# Patient Record
Sex: Female | Born: 1970 | Race: White | Hispanic: No | Marital: Married | Smoking: Current every day smoker
Health system: Southern US, Community
[De-identification: ages and names within clinical notes are randomized; demographics above are authoritative.]

## PROBLEM LIST (undated history)

## (undated) DIAGNOSIS — F329 Major depressive disorder, single episode, unspecified: Secondary | ICD-10-CM

## (undated) DIAGNOSIS — F419 Anxiety disorder, unspecified: Secondary | ICD-10-CM

## (undated) DIAGNOSIS — F603 Borderline personality disorder: Secondary | ICD-10-CM

## (undated) DIAGNOSIS — J449 Chronic obstructive pulmonary disease, unspecified: Secondary | ICD-10-CM

## (undated) DIAGNOSIS — I499 Cardiac arrhythmia, unspecified: Secondary | ICD-10-CM

## (undated) DIAGNOSIS — N3281 Overactive bladder: Secondary | ICD-10-CM

## (undated) DIAGNOSIS — F4310 Post-traumatic stress disorder, unspecified: Secondary | ICD-10-CM

## (undated) DIAGNOSIS — R5383 Other fatigue: Secondary | ICD-10-CM

## (undated) DIAGNOSIS — F32A Depression, unspecified: Secondary | ICD-10-CM

## (undated) DIAGNOSIS — F319 Bipolar disorder, unspecified: Secondary | ICD-10-CM

## (undated) HISTORY — DX: Overactive bladder: N32.81

## (undated) HISTORY — DX: Bipolar disorder, unspecified: F31.9

## (undated) HISTORY — PX: TUBAL LIGATION: SHX77

## (undated) HISTORY — DX: Major depressive disorder, single episode, unspecified: F32.9

## (undated) HISTORY — DX: Other fatigue: R53.83

## (undated) HISTORY — DX: Borderline personality disorder: F60.3

## (undated) HISTORY — DX: Anxiety disorder, unspecified: F41.9

## (undated) HISTORY — DX: Depression, unspecified: F32.A

## (undated) HISTORY — DX: Post-traumatic stress disorder, unspecified: F43.10

## (undated) HISTORY — DX: Chronic obstructive pulmonary disease, unspecified: J44.9

---

## 2016-03-09 ENCOUNTER — Encounter: Payer: Self-pay | Admitting: Internal Medicine

## 2016-03-25 ENCOUNTER — Ambulatory Visit (INDEPENDENT_AMBULATORY_CARE_PROVIDER_SITE_OTHER): Payer: BLUE CROSS/BLUE SHIELD | Admitting: Nurse Practitioner

## 2016-03-25 ENCOUNTER — Other Ambulatory Visit: Payer: Self-pay

## 2016-03-25 ENCOUNTER — Encounter: Payer: Self-pay | Admitting: Nurse Practitioner

## 2016-03-25 ENCOUNTER — Other Ambulatory Visit (HOSPITAL_COMMUNITY)
Admission: RE | Admit: 2016-03-25 | Discharge: 2016-03-25 | Disposition: A | Discharge status: Home / Self Care | Payer: BLUE CROSS/BLUE SHIELD | Source: Ambulatory Visit | Attending: Nurse Practitioner | Admitting: Nurse Practitioner

## 2016-03-25 DIAGNOSIS — Z87898 Personal history of other specified conditions: Secondary | ICD-10-CM | POA: Diagnosis not present

## 2016-03-25 DIAGNOSIS — R7989 Other specified abnormal findings of blood chemistry: Secondary | ICD-10-CM

## 2016-03-25 DIAGNOSIS — R195 Other fecal abnormalities: Secondary | ICD-10-CM | POA: Diagnosis not present

## 2016-03-25 DIAGNOSIS — R945 Abnormal results of liver function studies: Principal | ICD-10-CM

## 2016-03-25 DIAGNOSIS — F1011 Alcohol abuse, in remission: Secondary | ICD-10-CM | POA: Insufficient documentation

## 2016-03-25 DIAGNOSIS — K625 Hemorrhage of anus and rectum: Secondary | ICD-10-CM

## 2016-03-25 LAB — COMPREHENSIVE METABOLIC PANEL
ALK PHOS: 128 U/L — AB (ref 38–126)
ALT: 38 U/L (ref 14–54)
AST: 97 U/L — AB (ref 15–41)
Albumin: 3.5 g/dL (ref 3.5–5.0)
Anion gap: 8 (ref 5–15)
CALCIUM: 8.9 mg/dL (ref 8.9–10.3)
CO2: 26 mmol/L (ref 22–32)
CREATININE: 0.47 mg/dL (ref 0.44–1.00)
Chloride: 105 mmol/L (ref 101–111)
Glucose, Bld: 106 mg/dL — ABNORMAL HIGH (ref 65–99)
Potassium: 4 mmol/L (ref 3.5–5.1)
Sodium: 139 mmol/L (ref 135–145)
Total Bilirubin: 0.9 mg/dL (ref 0.3–1.2)
Total Protein: 7.4 g/dL (ref 6.5–8.1)

## 2016-03-25 LAB — CBC WITH DIFFERENTIAL/PLATELET
BASOS ABS: 0 10*3/uL (ref 0.0–0.1)
Basophils Relative: 0 %
Eosinophils Absolute: 0 10*3/uL (ref 0.0–0.7)
Eosinophils Relative: 0 %
HCT: 51.9 % — ABNORMAL HIGH (ref 36.0–46.0)
HEMOGLOBIN: 18.2 g/dL — AB (ref 12.0–15.0)
LYMPHS ABS: 1.2 10*3/uL (ref 0.7–4.0)
LYMPHS PCT: 13 %
MCH: 36.9 pg — AB (ref 26.0–34.0)
MCHC: 35.1 g/dL (ref 30.0–36.0)
MCV: 105.3 fL — AB (ref 78.0–100.0)
Monocytes Absolute: 0.5 10*3/uL (ref 0.1–1.0)
Monocytes Relative: 6 %
NEUTROS ABS: 7.4 10*3/uL (ref 1.7–7.7)
NEUTROS PCT: 81 %
Platelets: 168 10*3/uL (ref 150–400)
RBC: 4.93 MIL/uL (ref 3.87–5.11)
RDW: 15.2 % (ref 11.5–15.5)
WBC: 9.2 10*3/uL (ref 4.0–10.5)

## 2016-03-25 MED ORDER — PEG 3350-KCL-NA BICARB-NACL 420 G PO SOLR: 4000.0000 mL | ORAL | 0 refills | Status: DC

## 2016-03-25 NOTE — Patient Instructions (Signed)
1. Have your labs drawn when you're able to. 2. We will help you schedule your ultrasound of your liver. 3. Continue your excellent for's at avoiding alcohol. 4. We will schedule your procedures for you. 5. Further recommendations to be made based on the results of your procedures. 6. Return for follow-up in 8 weeks.

## 2016-03-25 NOTE — Progress Notes (Signed)
Primary Care Physician:  Alanda Amass, MD Primary Gastroenterologist:  Dr. Gala Romney  Chief Complaint  Patient presents with  . Abdominal Pain    RUQ, mid upper abd  . Elevated Hepatic Enzymes  . Diarrhea    HPI:   Tiffany Novak is a 45 y.o. female who presents on referral from primary care due to alcohol dependence, questionable liver damage, elevated LFTs, low magnesium and folate, never been to GI rather colonoscopy. The patient last saw primary care on 07/25/2015 at which point noted for the previous 2 weeks she was having fecal urgency with stools ranging from soft to diarrhea. Has never had colonoscopy before. Negative stool specimen. She was given a course of dicyclomine. Labs provided include CMP dated 03/04/2016 which show elevated alkaline phosphatase at 151, elevated AST/ALT 123/53, normal albumin at 3.79, CBC with a normal platelet count of 169, vitamin 123456 normal, folic acid low at 4.2. TSH normal, vitamin D normal, magnesium low at 1.38, hepatitis C antibody negative. Previous liver functions were normal in 2016.  Today she states she is tired a lot overall. Poor energy, sleeps a lot. Has RUQ abdominal pain and epigastric pain typically when vomiting and sharp. RUQ pain described as dull pain, intermittent, occurs about 3-4 times a day and lasts 10 min to 30 min, self-resolved typically with laying down and changing position. She has had people tell her her skin/eyes have yellowed, but her mom states she has never seen this. Denies darkened urine. Denies LE edema, abdominal swelling. Has had 1-2 episodes of rectal bleeding/brbpr last noted 2 weeks ago, 1-2 episodes then resolves. This was the only occurrence. Has noted dark, tarry stools. Denies chest pain, dyspnea, dizziness, lightheadedness, syncope, near syncope. Denies any other upper or lower GI symptoms.  Denies NSAIDs and ASA powders.  Past Medical History:  Diagnosis Date  . Anxiety and depression   . Bipolar disorder  ([place])   . Borderline personality disorder   . COPD (chronic obstructive pulmonary disease) ([place])   . Fatigue   . OAB (overactive bladder)   . PTSD (post-traumatic stress disorder)     No past surgical history on file.  Current Outpatient Prescriptions  Medication Sig Dispense Refill  . diazepam (VALIUM) 10 MG tablet Take 10 mg by mouth 3 (three) times daily as needed.    . Prenatal Vit-Fe Fumarate-FA (PNV PRENATAL PLUS MULTIVITAMIN) 27-1 MG TABS Take 1 tablet by mouth daily.    Marland Kitchen topiramate (TOPAMAX) 50 MG tablet Take 50 mg by mouth at bedtime.    . TRINTELLIX 10 MG TABS Take 10 mg by mouth daily.    . polyethylene glycol-electrolytes (TRILYTE) 420 g solution Take 4,000 mLs by mouth as directed. 4000 mL 0   No current facility-administered medications for this visit.     Allergies as of 03/25/2016  . (No Known Allergies)    Family History  Problem Relation Age of Onset  . Colon cancer Neg Hx   . Liver disease Neg Hx     Social History   Social History  . Marital status: Married    Spouse name: N/A  . Number of children: N/A  . Years of education: N/A   Occupational History  . Not on file.   Social History Main Topics  . Smoking status: Current Every Day Smoker    Packs/day: 1.00  . Smokeless tobacco: Never Used  . Alcohol use No     Comment: None since 03/22/16; Has been cutting back for 3  months (12/2015). Previously significant liquor use ($600/month worth)  . Drug use: No  . Sexual activity: Not on file   Other Topics Concern  . Not on file   Social History Narrative  . No narrative on file    Review of Systems: General: Negative for anorexia, weight loss, fever, chills, weakness. Eyes: Negative for vision changes.  ENT: Negative for hoarseness, difficulty swallowing. CV: Negative for chest pain, angina, palpitations, peripheral edema.  Respiratory: Negative for dyspnea at rest, cough, sputum, wheezing.  GI: See history of present illness. MS:  Negative for joint pain, low back pain.  Derm: Negative for rash or itching.  Neuro: Negative for memory loss, confusion.  Endo: Negative for unusual weight change.  Heme: Negative for bruising or bleeding.    Physical Exam: BP (!) 145/96   Pulse (!) 11   Temp 98.1 F (36.7 C) (Oral)   Ht 5\' 2"  (1.575 m)   Wt 123 lb 12.8 oz (56.2 kg)   BMI 22.64 kg/m  General:   Alert and oriented. Pleasant and cooperative. Well-nourished and well-developed.  Head:  Normocephalic and atraumatic. Eyes:  Without icterus, sclera clear and conjunctiva pink.  Ears:  Normal auditory acuity. Cardiovascular:  S1, S2 present without murmurs appreciated. Extremities without clubbing or edema. Respiratory:  Clear to auscultation bilaterally. No wheezes, rales, or rhonchi. No distress.  Gastrointestinal:  +BS, soft, and non-distended. No HSM noted. No guarding or rebound. No masses appreciated.  Rectal:  Deferred  Musculoskalatal:  Symmetrical without gross deformities.  Neurologic:  Alert and oriented x4;  grossly normal neurologically. Psych:  Alert and cooperative. Normal mood and affect. Heme/Lymph/Immune: No excessive bruising noted.    03/26/2016 3:10 PM   Disclaimer: This note was dictated with voice recognition software. Similar sounding words can inadvertently be transcribed and may not be corrected upon review.

## 2016-03-25 NOTE — Telephone Encounter (Signed)
Open error 

## 2016-03-26 ENCOUNTER — Encounter: Payer: Self-pay | Admitting: Nurse Practitioner

## 2016-03-26 LAB — HEPATITIS B SURFACE ANTIGEN: HEP B S AG: NEGATIVE

## 2016-03-26 LAB — ANTI-SMOOTH MUSCLE ANTIBODY, IGG: F-Actin IgG: 10 Units (ref 0–19)

## 2016-03-26 LAB — HEPATITIS B CORE ANTIBODY, TOTAL: HEP B C TOTAL AB: NEGATIVE

## 2016-03-26 LAB — MITOCHONDRIAL ANTIBODIES: Mitochondrial M2 Ab, IgG: 18.9 Units (ref 0.0–20.0)

## 2016-03-26 LAB — ANTINUCLEAR ANTIBODIES, IFA: ANTINUCLEAR ANTIBODIES, IFA: NEGATIVE

## 2016-03-26 NOTE — Assessment & Plan Note (Signed)
The patient is 45 years old but notes bright red blood per rectum 2 weeks ago. Given her age, and symptomatic presentation we will proceed with colonoscopy in addition to endoscopy as noted below. Return for follow-up in 8 weeks. The patient denies NSAID and aspirin powders.  Proceed with TCS on propofol/MAC with Dr. Gala Romney in near future: the risks, benefits, and alternatives have been discussed with the patient in detail. The patient states understanding and desires to proceed.  The patient is on Valium. She is not on any other anticoagulants, anxiolytics, chronic pain medications, or antidepressants. She does have a history of alcoholism and alcohol abuse. We will proceed with the procedure on propofol/MAC to promote adequate sedation.

## 2016-03-26 NOTE — Assessment & Plan Note (Addendum)
The patient notes dark stools consistent with melena. She also has elevated liver function tests, we are completing liver workup. We will add on  upper endoscopy in addition to her colonoscopy as above to evaluate for sources of melena. Not sure of the utility of checking heme stool card given her admitted hematochezia and this would likely not prove diagnostic for melena versus non-heme dark stools. Recommend she avoid further alcohol consumption. She does have epigastric pain when vomiting, possibility of Mallory-Weiss tear intermittently. Also the possibility of alcohol-induced gastritis. Also possibility of esophagitis, peptic ulcer disease. She denies NSAIDs and aspirin powder use. Return for follow-up in 8 weeks.  Proceed with EGD on propofol/MAC with Dr. Gala Romney in near future: the risks, benefits, and alternatives have been discussed with the patient in detail. The patient states understanding and desires to proceed.  The patient is on Valium. She is not on any other anticoagulants, anxiolytics, chronic pain medications, or antidepressants. She does have a history of alcoholism and alcohol abuse. We will proceed with the procedure on propofol/MAC to promote adequate sedation.

## 2016-03-26 NOTE — Assessment & Plan Note (Signed)
The patient has a history of elevated LFTs, likely due to alcohol consumption. However, cannot rule out other etiologies. Hepatitis C antibody is artery been checked. Today I will recheck liver enzymes with a CMP. I will also check CBC, ANA, anti-smooth muscle antibody, antimicrosomal meats oatmeal antibody of the kidney and liver, hepatitis B serologies. I will also do a right upper quadrant ultrasound elastography to check for fibrosis and cirrhosis. Return for follow-up in 8 weeks.

## 2016-03-26 NOTE — Assessment & Plan Note (Signed)
The patient has a history of alcoholism and alcohol abuse. She has not had alcohol in 3 days after spending 2-3 months slowly reducing her consumption. She seems determined to quit alcohol. Recommend she avoid alcoholic given her elevated liver function tests. Further liver workup as noted below. Return for follow-up in 8 weeks.

## 2016-03-30 NOTE — Progress Notes (Signed)
CC'D TO PCP °

## 2016-03-31 ENCOUNTER — Ambulatory Visit (HOSPITAL_COMMUNITY)
Admission: RE | Admit: 2016-03-31 | Discharge: 2016-03-31 | Disposition: A | Discharge status: Home / Self Care | Payer: BLUE CROSS/BLUE SHIELD | Source: Ambulatory Visit | Attending: Nurse Practitioner | Admitting: Nurse Practitioner

## 2016-03-31 DIAGNOSIS — K625 Hemorrhage of anus and rectum: Secondary | ICD-10-CM | POA: Insufficient documentation

## 2016-03-31 DIAGNOSIS — Z87898 Personal history of other specified conditions: Secondary | ICD-10-CM | POA: Diagnosis not present

## 2016-03-31 DIAGNOSIS — R7989 Other specified abnormal findings of blood chemistry: Secondary | ICD-10-CM | POA: Insufficient documentation

## 2016-03-31 DIAGNOSIS — R195 Other fecal abnormalities: Secondary | ICD-10-CM | POA: Diagnosis not present

## 2016-03-31 DIAGNOSIS — F1011 Alcohol abuse, in remission: Secondary | ICD-10-CM

## 2016-03-31 DIAGNOSIS — R945 Abnormal results of liver function studies: Secondary | ICD-10-CM

## 2016-04-06 ENCOUNTER — Telehealth: Payer: Self-pay

## 2016-04-06 NOTE — Telephone Encounter (Signed)
Pt is calling to get her results from her [place]. Please advise

## 2016-04-07 ENCOUNTER — Telehealth: Payer: Self-pay | Admitting: Internal Medicine

## 2016-04-07 NOTE — Telephone Encounter (Signed)
Routing to EG 

## 2016-04-07 NOTE — Telephone Encounter (Signed)
Patient calling for her ultrasound results, [phone]

## 2016-04-07 NOTE — Telephone Encounter (Signed)
See result note.  

## 2016-04-08 ENCOUNTER — Telehealth: Payer: Self-pay | Admitting: Internal Medicine

## 2016-04-08 NOTE — Telephone Encounter (Signed)
Ginger spoke with the pt and answered her questions.

## 2016-04-08 NOTE — Telephone Encounter (Signed)
Pt said she was returning JL call. JL not available and call transferred to VM

## 2016-04-08 NOTE — Telephone Encounter (Signed)
Tried to call pt- NA- LMOM 

## 2016-04-16 NOTE — Patient Instructions (Signed)
Tiffany Novak  04/16/2016     @PREFPERIOPPHARMACY @   Your procedure is scheduled on  04/24/2016  Report to [facility] at  1015  A.M.  Call this number if you have problems the morning of surgery:  [phone]   Remember:  Do not eat food or drink liquids after midnight.  Take these medicines the morning of surgery with A SIP OF WATER  Valium, topamax. Take your inhaler before you come.   Do not wear jewelry, make-up or nail polish.  Do not wear lotions, powders, or perfumes, or deoderant.  Do not shave 48 hours prior to surgery.  Men may shave face and neck.  Do not bring valuables to the hospital.  [facility] is not responsible for any belongings or valuables.  Contacts, dentures or bridgework may not be worn into surgery.  Leave your suitcase in the car.  After surgery it may be brought to your room.  For patients admitted to the hospital, discharge time will be determined by your treatment team.  Patients discharged the day of surgery will not be allowed to drive home.   Name and phone number of your driver:   family Special instructions:  Follow the diet and prep instructions given to you by Dr Roseanne Kaufman office.  Please read over the following fact sheets that you were given. Anesthesia Post-op Instructions and Care and Recovery After Surgery       Esophagogastroduodenoscopy Introduction Esophagogastroduodenoscopy (EGD) is a procedure to examine the lining of the esophagus, stomach, and first part of the small intestine (duodenum). This procedure is done to check for problems such as inflammation, bleeding, ulcers, or growths. During this procedure, a long, flexible, lighted tube with a camera attached (endoscope) is inserted down the throat. Tell a health care provider about:  Any allergies you have.  All medicines you are taking, including vitamins, herbs, eye drops, creams, and over-the-counter medicines.  Any problems you or family  members have had with anesthetic medicines.  Any blood disorders you have.  Any surgeries you have had.  Any medical conditions you have.  Whether you are pregnant or may be pregnant. What are the risks? Generally, this is a safe procedure. However, problems may occur, including:  Infection.  Bleeding.  A tear (perforation) in the esophagus, stomach, or duodenum.  Trouble breathing.  Excessive sweating.  Spasms of the larynx.  A slowed heartbeat.  Low blood pressure. What happens before the procedure?  Follow instructions from your health care provider about eating or drinking restrictions.  Ask your health care provider about:  Changing or stopping your regular medicines. This is especially important if you are taking diabetes medicines or blood thinners.  Taking medicines such as aspirin and ibuprofen. These medicines can thin your blood. Do not take these medicines before your procedure if your health care provider instructs you not to.  Plan to have someone take you home after the procedure.  If you wear dentures, be ready to remove them before the procedure. What happens during the procedure?  To reduce your risk of infection, your health care team will wash or sanitize their hands.  An IV tube will be put in a vein in your hand or arm. You will get medicines and fluids through this tube.  You will be given one or more of the following:  A medicine to help you relax (sedative).  A medicine to numb the  area (local anesthetic). This medicine may be sprayed into your throat. It will make you feel more comfortable and keep you from gagging or coughing during the procedure.  A medicine for pain.  A mouth guard may be placed in your mouth to protect your teeth and to keep you from biting on the endoscope.  You will be asked to lie on your left side.  The endoscope will be lowered down your throat into your esophagus, stomach, and duodenum.  Air will be put  into the endoscope. This will help your health care provider see better.  The lining of your esophagus, stomach, and duodenum will be examined.  Your health care provider may:  Take a tissue sample so it can be looked at in a lab (biopsy).  Remove growths.  Remove objects (foreign bodies) that are stuck.  Treat any bleeding with medicines or other devices that stop tissue from bleeding.  Widen (dilate) or stretch narrowed areas of your esophagus and stomach.  The endoscope will be taken out. The procedure may vary among health care providers and hospitals. What happens after the procedure?  Your blood pressure, heart rate, breathing rate, and blood oxygen level will be monitored often until the medicines you were given have worn off.  Do not eat or drink anything until the numbing medicine has worn off and your gag reflex has returned. This information is not intended to replace advice given to you by your health care provider. Make sure you discuss any questions you have with your health care provider. Document Released: 08/07/2004 Document Revised: 09/12/2015 Document Reviewed: 02/28/2015  2017 Elsevier Esophagogastroduodenoscopy, Care After Introduction Refer to this sheet in the next few weeks. These instructions provide you with information about caring for yourself after your procedure. Your health care provider may also give you more specific instructions. Your treatment has been planned according to current medical practices, but problems sometimes occur. Call your health care provider if you have any problems or questions after your procedure. What can I expect after the procedure? After the procedure, it is common to have:  A sore throat.  Nausea.  Bloating.  Dizziness.  Fatigue. Follow these instructions at home:  Do not eat or drink anything until the numbing medicine (local anesthetic) has worn off and your gag reflex has returned. You will know that the local  anesthetic has worn off when you can swallow comfortably.  Do not drive for 24 hours if you received a medicine to help you relax (sedative).  If your health care provider took a tissue sample for testing during the procedure, make sure to get your test results. This is your responsibility. Ask your health care provider or the department performing the test when your results will be ready.  Keep all follow-up visits as told by your health care provider. This is important. Contact a health care provider if:  You cannot stop coughing.  You are not urinating.  You are urinating less than usual. Get help right away if:  You have trouble swallowing.  You cannot eat or drink.  You have throat or chest pain that gets worse.  You are dizzy or light-headed.  You faint.  You have nausea or vomiting.  You have chills.  You have a fever.  You have severe abdominal pain.  You have black, tarry, or bloody stools. This information is not intended to replace advice given to you by your health care provider. Make sure you discuss any questions you have  with your health care provider. Document Released: 03/23/2012 Document Revised: 09/12/2015 Document Reviewed: 02/28/2015  2017 Elsevier  Colonoscopy, Adult A colonoscopy is an exam to look at the entire large intestine. During the exam, a lubricated, bendable tube is inserted into the anus and then passed into the rectum, colon, and other parts of the large intestine. A colonoscopy is often done as a part of normal colorectal screening or in response to certain symptoms, such as anemia, persistent diarrhea, abdominal pain, and blood in the stool. The exam can help screen for and diagnose medical problems, including:  Tumors.  Polyps.  Inflammation.  Areas of bleeding. Tell a health care provider about:  Any allergies you have.  All medicines you are taking, including vitamins, herbs, eye drops, creams, and over-the-counter  medicines.  Any problems you or family members have had with anesthetic medicines.  Any blood disorders you have.  Any surgeries you have had.  Any medical conditions you have.  Any problems you have had passing stool. What are the risks? Generally, this is a safe procedure. However, problems may occur, including:  Bleeding.  A tear in the intestine.  A reaction to medicines given during the exam.  Infection (rare). What happens before the procedure? Eating and drinking restrictions  Follow instructions from your health care provider about eating and drinking, which may include:  A few days before the procedure - follow a low-fiber diet. Avoid nuts, seeds, dried fruit, raw fruits, and vegetables.  1-3 days before the procedure - follow a clear liquid diet. Drink only clear liquids, such as clear broth or bouillon, black coffee or tea, clear juice, clear soft drinks or sports drinks, gelatin desert, and popsicles. Avoid any liquids that contain red or purple dye.  On the day of the procedure - do not eat or drink anything during the 2 hours before the procedure, or within the time period that your health care provider recommends. Bowel prep  If you were prescribed an oral bowel prep to clean out your colon:  Take it as told by your health care provider. Starting the day before your procedure, you will need to drink a large amount of medicated liquid. The liquid will cause you to have multiple loose stools until your stool is almost clear or light green.  If your skin or anus gets irritated from diarrhea, you may use these to relieve the irritation:  Medicated wipes, such as adult wet wipes with aloe and vitamin E.  A skin soothing-product like petroleum jelly.  If you vomit while drinking the bowel prep, take a break for up to 60 minutes and then begin the bowel prep again. If vomiting continues and you cannot take the bowel prep without vomiting, call your health care  provider. General instructions  Ask your health care provider about changing or stopping your regular medicines. This is especially important if you are taking diabetes medicines or blood thinners.  Plan to have someone take you home from the hospital or clinic. What happens during the procedure?  An IV tube may be inserted into one of your veins.  You will be given medicine to help you relax (sedative).  To reduce your risk of infection:  Your health care team will wash or sanitize their hands.  Your anal area will be washed with soap.  You will be asked to lie on your side with your knees bent.  Your health care provider will lubricate a long, thin, flexible tube. The tube will  have a camera and a light on the end.  The tube will be inserted into your anus.  The tube will be gently eased through your rectum and colon.  Air will be delivered into your colon to keep it open. You may feel some pressure or cramping.  The camera will be used to take images during the procedure.  A small tissue sample may be removed from your body to be examined under a microscope (biopsy). If any potential problems are found, the tissue will be sent to a lab for testing.  If small polyps are found, your health care provider may remove them and have them checked for cancer cells.  The tube that was inserted into your anus will be slowly removed. The procedure may vary among health care providers and hospitals. What happens after the procedure?  Your blood pressure, heart rate, breathing rate, and blood oxygen level will be monitored until the medicines you were given have worn off.  Do not drive for 24 hours after the exam.  You may have a small amount of blood in your stool.  You may pass gas and have mild abdominal cramping or bloating due to the air that was used to inflate your colon during the exam.  It is up to you to get the results of your procedure. Ask your health care provider, or  the department performing the procedure, when your results will be ready. This information is not intended to replace advice given to you by your health care provider. Make sure you discuss any questions you have with your health care provider. Document Released: 04/03/2000 Document Revised: 10/25/2015 Document Reviewed: 06/18/2015 Elsevier Interactive Patient Education  2017 [employer]  Colonoscopy, Adult, Care After This sheet gives you information about how to care for yourself after your procedure. Your health care provider may also give you more specific instructions. If you have problems or questions, contact your health care provider. What can I expect after the procedure? After the procedure, it is common to have:  A small amount of blood in your stool for 24 hours after the procedure.  Some gas.  Mild abdominal cramping or bloating. Follow these instructions at home: General instructions  For the first 24 hours after the procedure:  Do not drive or use machinery.  Do not sign important documents.  Do not drink alcohol.  Do your regular daily activities at a slower pace than normal.  Eat soft, easy-to-digest foods.  Rest often.  Take over-the-counter or prescription medicines only as told by your health care provider.  It is up to you to get the results of your procedure. Ask your health care provider, or the department performing the procedure, when your results will be ready. Relieving cramping and bloating  Try walking around when you have cramps or feel bloated.  Apply heat to your abdomen as told by your health care provider. Use a heat source that your health care provider recommends, such as a moist heat pack or a heating pad.  Place a towel between your skin and the heat source.  Leave the heat on for 20-30 minutes.  Remove the heat if your skin turns bright red. This is especially important if you are unable to feel pain, heat, or cold. You may have a  greater risk of getting burned. Eating and drinking  Drink enough fluid to keep your urine clear or pale yellow.  Resume your normal diet as instructed by your health care provider. Avoid heavy  or fried foods that are hard to digest.  Avoid drinking alcohol for as long as instructed by your health care provider. Contact a health care provider if:  You have blood in your stool 2-3 days after the procedure. Get help right away if:  You have more than a small spotting of blood in your stool.  You pass large blood clots in your stool.  Your abdomen is swollen.  You have nausea or vomiting.  You have a fever.  You have increasing abdominal pain that is not relieved with medicine. This information is not intended to replace advice given to you by your health care provider. Make sure you discuss any questions you have with your health care provider. Document Released: 11/19/2003 Document Revised: 12/30/2015 Document Reviewed: 06/18/2015 Elsevier Interactive Patient Education  2017 [place] Anesthesia is a term that refers to techniques, procedures, and medicines that help a person stay safe and comfortable during a medical procedure. Monitored anesthesia care, or sedation, is one type of anesthesia. Your anesthesia specialist may recommend sedation if you will be having a procedure that does not require you to be unconscious, such as:  Cataract surgery.  A dental procedure.  A biopsy.  A colonoscopy. During the procedure, you may receive a medicine to help you relax (sedative). There are three levels of sedation:  Mild sedation. At this level, you may feel awake and relaxed. You will be able to follow directions.  Moderate sedation. At this level, you will be sleepy. You may not remember the procedure.  Deep sedation. At this level, you will be asleep. You will not remember the procedure. The more medicine you are given, the deeper your level of  sedation will be. Depending on how you respond to the procedure, the anesthesia specialist may change your level of sedation or the type of anesthesia to fit your needs. An anesthesia specialist will monitor you closely during the procedure. Let your health care provider know about:  Any allergies you have.  All medicines you are taking, including vitamins, herbs, eye drops, creams, and over-the-counter medicines.  Any use of steroids (by mouth or as a cream).  Any problems you or family members have had with sedatives and anesthetic medicines.  Any blood disorders you have.  Any surgeries you have had.  Any medical conditions you have, such as sleep apnea.  Whether you are pregnant or may be pregnant.  Any use of cigarettes, alcohol, or street drugs. What are the risks? Generally, this is a safe procedure. However, problems may occur, including:  Getting too much medicine (oversedation).  Nausea.  Allergic reaction to medicines.  Trouble breathing. If this happens, a breathing tube may be used to help with breathing. It will be removed when you are awake and breathing on your own.  Heart trouble.  Lung trouble. Before the procedure Staying hydrated  Follow instructions from your health care provider about hydration, which may include:  Up to 2 hours before the procedure - you may continue to drink clear liquids, such as water, clear fruit juice, black coffee, and plain tea. Eating and drinking restrictions  Follow instructions from your health care provider about eating and drinking, which may include:  8 hours before the procedure - stop eating heavy meals or foods such as meat, fried foods, or fatty foods.  6 hours before the procedure - stop eating light meals or foods, such as toast or cereal.  6 hours before the procedure -  stop drinking milk or drinks that contain milk.  2 hours before the procedure - stop drinking clear liquids. Medicines  Ask your health  care provider about:  Changing or stopping your regular medicines. This is especially important if you are taking diabetes medicines or blood thinners.  Taking medicines such as aspirin and ibuprofen. These medicines can thin your blood. Do not take these medicines before your procedure if your health care provider instructs you not to. Tests and exams  You will have a physical exam.  You may have blood tests done to show:  How well your kidneys and liver are working.  How well your blood can clot.  General instructions  Plan to have someone take you home from the hospital or clinic.  If you will be going home right after the procedure, plan to have someone with you for 24 hours. What happens during the procedure?  Your blood pressure, heart rate, breathing, level of pain and overall condition will be monitored.  An IV tube will be inserted into one of your veins.  Your anesthesia specialist will give you medicines as needed to keep you comfortable during the procedure. This may mean changing the level of sedation.  The procedure will be performed. After the procedure  Your blood pressure, heart rate, breathing rate, and blood oxygen level will be monitored until the medicines you were given have worn off.  Do not drive for 24 hours if you received a sedative.  You may:  Feel sleepy, clumsy, or nauseous.  Feel forgetful about what happened after the procedure.  Have a sore throat if you had a breathing tube during the procedure.  Vomit. This information is not intended to replace advice given to you by your health care provider. Make sure you discuss any questions you have with your health care provider. Document Released: 12/31/2004 Document Revised: 09/13/2015 Document Reviewed: 07/28/2015 Elsevier Interactive Patient Education  2017 [place], Care After These instructions provide you with information about caring for yourself after  your procedure. Your health care provider may also give you more specific instructions. Your treatment has been planned according to current medical practices, but problems sometimes occur. Call your health care provider if you have any problems or questions after your procedure. What can I expect after the procedure? After your procedure, it is common to:  Feel sleepy for several hours.  Feel clumsy and have poor balance for several hours.  Feel forgetful about what happened after the procedure.  Have poor judgment for several hours.  Feel nauseous or vomit.  Have a sore throat if you had a breathing tube during the procedure. Follow these instructions at home: For at least 24 hours after the procedure:   Do not:  Participate in activities in which you could fall or become injured.  Drive.  Use heavy machinery.  Drink alcohol.  Take sleeping pills or medicines that cause drowsiness.  Make important decisions or sign legal documents.  Take care of children on your own.  Rest. Eating and drinking  Follow the diet that is recommended by your health care provider.  If you vomit, drink water, juice, or soup when you can drink without vomiting.  Make sure you have little or no nausea before eating solid foods. General instructions  Have a responsible adult stay with you until you are awake and alert.  Take over-the-counter and prescription medicines only as told by your health care provider.  If you smoke, do  not smoke without supervision.  Keep all follow-up visits as told by your health care provider. This is important. Contact a health care provider if:  You keep feeling nauseous or you keep vomiting.  You feel light-headed.  You develop a rash.  You have a fever. Get help right away if:  You have trouble breathing. This information is not intended to replace advice given to you by your health care provider. Make sure you discuss any questions you have  with your health care provider. Document Released: 07/28/2015 Document Revised: 11/27/2015 Document Reviewed: 07/28/2015 Elsevier Interactive Patient Education  2017 [employer].

## 2016-04-21 ENCOUNTER — Encounter (HOSPITAL_COMMUNITY)
Admission: RE | Admit: 2016-04-21 | Discharge: 2016-04-21 | Disposition: A | Discharge status: Home / Self Care | Payer: BLUE CROSS/BLUE SHIELD | Source: Ambulatory Visit | Attending: Internal Medicine | Admitting: Internal Medicine

## 2016-04-21 ENCOUNTER — Encounter (HOSPITAL_COMMUNITY): Payer: Self-pay

## 2016-04-21 DIAGNOSIS — K921 Melena: Secondary | ICD-10-CM | POA: Diagnosis not present

## 2016-04-21 DIAGNOSIS — K5730 Diverticulosis of large intestine without perforation or abscess without bleeding: Secondary | ICD-10-CM | POA: Diagnosis not present

## 2016-04-21 DIAGNOSIS — R1011 Right upper quadrant pain: Secondary | ICD-10-CM | POA: Diagnosis present

## 2016-04-21 DIAGNOSIS — K209 Esophagitis, unspecified: Secondary | ICD-10-CM | POA: Diagnosis not present

## 2016-04-21 DIAGNOSIS — K635 Polyp of colon: Secondary | ICD-10-CM | POA: Diagnosis not present

## 2016-04-21 DIAGNOSIS — F17210 Nicotine dependence, cigarettes, uncomplicated: Secondary | ICD-10-CM | POA: Diagnosis not present

## 2016-04-21 DIAGNOSIS — J449 Chronic obstructive pulmonary disease, unspecified: Secondary | ICD-10-CM | POA: Diagnosis not present

## 2016-04-21 DIAGNOSIS — K449 Diaphragmatic hernia without obstruction or gangrene: Secondary | ICD-10-CM | POA: Diagnosis not present

## 2016-04-21 DIAGNOSIS — Z79899 Other long term (current) drug therapy: Secondary | ICD-10-CM | POA: Diagnosis not present

## 2016-04-21 HISTORY — DX: Cardiac arrhythmia, unspecified: I49.9

## 2016-04-21 LAB — HCG, SERUM, QUALITATIVE: Preg, Serum: NEGATIVE

## 2016-04-24 ENCOUNTER — Ambulatory Visit (HOSPITAL_COMMUNITY)
Admission: RE | Admit: 2016-04-24 | Discharge: 2016-04-24 | Disposition: A | Discharge status: Home / Self Care | Payer: BLUE CROSS/BLUE SHIELD | Source: Ambulatory Visit | Attending: Internal Medicine | Admitting: Internal Medicine

## 2016-04-24 ENCOUNTER — Ambulatory Visit (HOSPITAL_COMMUNITY): Payer: BLUE CROSS/BLUE SHIELD | Admitting: Anesthesiology

## 2016-04-24 ENCOUNTER — Encounter (HOSPITAL_COMMUNITY): Payer: Self-pay | Admitting: *Deleted

## 2016-04-24 ENCOUNTER — Encounter (HOSPITAL_COMMUNITY)
Admission: RE | Disposition: A | Discharge status: Home / Self Care | Payer: Self-pay | Source: Ambulatory Visit | Attending: Internal Medicine

## 2016-04-24 DIAGNOSIS — K209 Esophagitis, unspecified: Secondary | ICD-10-CM | POA: Diagnosis not present

## 2016-04-24 DIAGNOSIS — K5730 Diverticulosis of large intestine without perforation or abscess without bleeding: Secondary | ICD-10-CM | POA: Insufficient documentation

## 2016-04-24 DIAGNOSIS — Z79899 Other long term (current) drug therapy: Secondary | ICD-10-CM | POA: Insufficient documentation

## 2016-04-24 DIAGNOSIS — K921 Melena: Secondary | ICD-10-CM | POA: Diagnosis not present

## 2016-04-24 DIAGNOSIS — K449 Diaphragmatic hernia without obstruction or gangrene: Secondary | ICD-10-CM | POA: Insufficient documentation

## 2016-04-24 DIAGNOSIS — D125 Benign neoplasm of sigmoid colon: Secondary | ICD-10-CM | POA: Diagnosis not present

## 2016-04-24 DIAGNOSIS — K625 Hemorrhage of anus and rectum: Secondary | ICD-10-CM

## 2016-04-24 DIAGNOSIS — F17210 Nicotine dependence, cigarettes, uncomplicated: Secondary | ICD-10-CM | POA: Insufficient documentation

## 2016-04-24 DIAGNOSIS — R7989 Other specified abnormal findings of blood chemistry: Secondary | ICD-10-CM

## 2016-04-24 DIAGNOSIS — J449 Chronic obstructive pulmonary disease, unspecified: Secondary | ICD-10-CM | POA: Insufficient documentation

## 2016-04-24 DIAGNOSIS — R945 Abnormal results of liver function studies: Secondary | ICD-10-CM

## 2016-04-24 DIAGNOSIS — K635 Polyp of colon: Secondary | ICD-10-CM | POA: Insufficient documentation

## 2016-04-24 HISTORY — PX: ESOPHAGOGASTRODUODENOSCOPY (EGD) WITH PROPOFOL: SHX5813

## 2016-04-24 HISTORY — PX: COLONOSCOPY WITH PROPOFOL: SHX5780

## 2016-04-24 SURGERY — COLONOSCOPY WITH PROPOFOL
Anesthesia: Monitor Anesthesia Care

## 2016-04-24 MED ORDER — LIDOCAINE VISCOUS 2 % MT SOLN
15.0000 mL | Freq: Once | OROMUCOSAL | Status: AC
  Administered 2016-04-24: 15 mL via OROMUCOSAL

## 2016-04-24 MED ORDER — MIDAZOLAM HCL 5 MG/5ML IJ SOLN
INTRAMUSCULAR | Status: DC | PRN
  Administered 2016-04-24: 1 mg via INTRAVENOUS
  Administered 2016-04-24 (×2): 2 mg via INTRAVENOUS

## 2016-04-24 MED ORDER — PROPOFOL 500 MG/50ML IV EMUL
INTRAVENOUS | Status: DC | PRN
  Administered 2016-04-24: 150 ug/kg/min via INTRAVENOUS
  Administered 2016-04-24 (×2): via INTRAVENOUS

## 2016-04-24 MED ORDER — CHLORHEXIDINE GLUCONATE CLOTH 2 % EX PADS: 6.0000 | MEDICATED_PAD | Freq: Once | CUTANEOUS | Status: DC

## 2016-04-24 MED ORDER — FENTANYL CITRATE (PF) 100 MCG/2ML IJ SOLN
25.0000 ug | INTRAMUSCULAR | Status: AC | PRN
  Administered 2016-04-24 (×2): 25 ug via INTRAVENOUS

## 2016-04-24 MED ORDER — MIDAZOLAM HCL 2 MG/2ML IJ SOLN
1.0000 mg | INTRAMUSCULAR | Status: DC | PRN
  Administered 2016-04-24: 2 mg via INTRAVENOUS

## 2016-04-24 MED ORDER — LIDOCAINE HCL (PF) 1 % IJ SOLN
INTRAMUSCULAR | Status: AC
  Filled 2016-04-24: qty 5

## 2016-04-24 MED ORDER — LIDOCAINE VISCOUS 2 % MT SOLN: 5.0000 mL | Freq: Once | OROMUCOSAL | Status: DC

## 2016-04-24 MED ORDER — LIDOCAINE VISCOUS 2 % MT SOLN
OROMUCOSAL | Status: AC
  Filled 2016-04-24: qty 15

## 2016-04-24 MED ORDER — PROPOFOL 10 MG/ML IV BOLUS
INTRAVENOUS | Status: AC
  Filled 2016-04-24: qty 20

## 2016-04-24 MED ORDER — MIDAZOLAM HCL 2 MG/2ML IJ SOLN
INTRAMUSCULAR | Status: AC
  Filled 2016-04-24: qty 2

## 2016-04-24 MED ORDER — MIDAZOLAM HCL 5 MG/5ML IJ SOLN
INTRAMUSCULAR | Status: AC
  Filled 2016-04-24: qty 5

## 2016-04-24 MED ORDER — LACTATED RINGERS IV SOLN
INTRAVENOUS | Status: DC
  Administered 2016-04-24: 11:00:00 via INTRAVENOUS

## 2016-04-24 MED ORDER — FENTANYL CITRATE (PF) 100 MCG/2ML IJ SOLN
INTRAMUSCULAR | Status: AC
  Filled 2016-04-24: qty 2

## 2016-04-24 NOTE — Anesthesia Postprocedure Evaluation (Signed)
Anesthesia Post Note  Patient: Sinda Du  Procedure(s) Performed: Procedure(s) (LRB): COLONOSCOPY WITH PROPOFOL (N/A) ESOPHAGOGASTRODUODENOSCOPY (EGD) WITH PROPOFOL (N/A)  Patient location during evaluation: PACU Anesthesia Type: MAC Level of consciousness: awake and patient cooperative Pain management: pain level controlled Vital Signs Assessment: post-procedure vital signs reviewed and stable Respiratory status: spontaneous breathing, nonlabored ventilation and respiratory function stable Cardiovascular status: blood pressure returned to baseline Postop Assessment: no signs of nausea or vomiting Anesthetic complications: no     Last Vitals:  Vitals:   04/24/16 1155 04/24/16 1200  BP: 102/65 113/63  Pulse:    Resp: (!) 32 20  Temp:      Last Pain:  Vitals:   04/24/16 1027  TempSrc: Oral                 Samuel Mcpeek J

## 2016-04-24 NOTE — Transfer of Care (Signed)
Immediate Anesthesia Transfer of Care Note  Patient: Tiffany Novak  Procedure(s) Performed: Procedure(s) with comments: COLONOSCOPY WITH PROPOFOL (N/A) - 1200 ESOPHAGOGASTRODUODENOSCOPY (EGD) WITH PROPOFOL (N/A)  Patient Location: PACU  Anesthesia Type:MAC  Level of Consciousness: awake and patient cooperative  Airway & Oxygen Therapy: Patient Spontanous Breathing and Patient connected to face mask oxygen  Post-op Assessment: Report given to RN, Post -op Vital signs reviewed and stable and Patient moving all extremities  Post vital signs: Reviewed and stable  Last Vitals:  Vitals:   04/24/16 1155 04/24/16 1200  BP: 102/65 113/63  Pulse:    Resp: (!) 32 20  Temp:      Last Pain:  Vitals:   04/24/16 1027  TempSrc: Oral      Patients Stated Pain Goal: 3 (67/01/41 0301)  Complications: No apparent anesthesia complications

## 2016-04-24 NOTE — Discharge Instructions (Addendum)
°Colonoscopy °Discharge Instructions ° °Read the instructions outlined below and refer to this sheet in the next few weeks. These discharge instructions provide you with general information on caring for yourself after you leave the hospital. Your doctor may also give you specific instructions. While your treatment has been planned according to the most current medical practices available, unavoidable complications occasionally occur. If you have any problems or questions after discharge, call Dr. Rourk at [phone]. °ACTIVITY °· You may resume your regular activity, but move at a slower pace for the next 24 hours.  °· Take frequent rest periods for the next 24 hours.  °· Walking will help get rid of the air and reduce the bloated feeling in your belly (abdomen).  °· No driving for 24 hours (because of the medicine (anesthesia) used during the test).   °· Do not sign any important legal documents or operate any machinery for 24 hours (because of the anesthesia used during the test).  °NUTRITION °· Drink plenty of fluids.  °· You may resume your normal diet as instructed by your doctor.  °· Begin with a light meal and progress to your normal diet. Heavy or fried foods are harder to digest and may make you feel sick to your stomach (nauseated).  °· Avoid alcoholic beverages for 24 hours or as instructed.  °MEDICATIONS °· You may resume your normal medications unless your doctor tells you otherwise.  °WHAT YOU CAN EXPECT TODAY °· Some feelings of bloating in the abdomen.  °· Passage of more gas than usual.  °· Spotting of blood in your stool or on the toilet paper.  °IF YOU HAD POLYPS REMOVED DURING THE COLONOSCOPY: °· No aspirin products for 7 days or as instructed.  °· No alcohol for 7 days or as instructed.  °· Eat a soft diet for the next 24 hours.  °FINDING OUT THE RESULTS OF YOUR TEST °Not all test results are available during your visit. If your test results are not back during the visit, make an appointment  with your caregiver to find out the results. Do not assume everything is normal if you have not heard from your caregiver or the medical facility. It is important for you to follow up on all of your test results.  °SEEK IMMEDIATE MEDICAL ATTENTION IF: °· You have more than a spotting of blood in your stool.  °· Your belly is swollen (abdominal distention).  °· You are nauseated or vomiting.  °· You have a temperature over 101.  °· You have abdominal pain or discomfort that is severe or gets worse throughout the day.  °EGD °Discharge instructions °Please read the instructions outlined below and refer to this sheet in the next few weeks. These discharge instructions provide you with general information on caring for yourself after you leave the hospital. Your doctor may also give you specific instructions. While your treatment has been planned according to the most current medical practices available, unavoidable complications occasionally occur. If you have any problems or questions after discharge, please call your doctor. °ACTIVITY °· You may resume your regular activity but move at a slower pace for the next 24 hours.  °· Take frequent rest periods for the next 24 hours.  °· Walking will help expel (get rid of) the air and reduce the bloated feeling in your abdomen.  °· No driving for 24 hours (because of the anesthesia (medicine) used during the test).  °· You may shower.  °· Do not sign any important   legal documents or operate any machinery for 24 hours (because of the anesthesia used during the test).  NUTRITION  Drink plenty of fluids.   You may resume your normal diet.   Begin with a light meal and progress to your normal diet.   Avoid alcoholic beverages for 24 hours or as instructed by your caregiver.  MEDICATIONS  You may resume your normal medications unless your caregiver tells you otherwise.  WHAT YOU CAN EXPECT TODAY  You may experience abdominal discomfort such as a feeling of fullness  or gas pains.  FOLLOW-UP  Your doctor will discuss the results of your test with you.  SEEK IMMEDIATE MEDICAL ATTENTION IF ANY OF THE FOLLOWING OCCUR:  Excessive nausea (feeling sick to your stomach) and/or vomiting.   Severe abdominal pain and distention (swelling).   Trouble swallowing.   Temperature over 101 F (37.8 C).   Rectal bleeding or vomiting of blood.    Gastroesophageal Reflux Disease, Adult Normally, food travels down the esophagus and stays in the stomach to be digested. However, when a person has gastroesophageal reflux disease (GERD), food and stomach acid move back up into the esophagus. When this happens, the esophagus becomes sore and inflamed. Over time, GERD can create small holes (ulcers) in the lining of the esophagus. What are the causes? This condition is caused by a problem with the muscle between the esophagus and the stomach (lower esophageal sphincter, or LES). Normally, the LES muscle closes after food passes through the esophagus to the stomach. When the LES is weakened or abnormal, it does not close properly, and that allows food and stomach acid to go back up into the esophagus. The LES can be weakened by certain dietary substances, medicines, and medical conditions, including:  Tobacco use.  Pregnancy.  Having a hiatal hernia.  Heavy alcohol use.  Certain foods and beverages, such as coffee, chocolate, onions, and peppermint. What increases the risk? This condition is more likely to develop in:  People who have an increased body weight.  People who have connective tissue disorders.  People who use NSAID medicines. What are the signs or symptoms? Symptoms of this condition include:  Heartburn.  Difficult or painful swallowing.  The feeling of having a lump in the throat.  Abitter taste in the mouth.  Bad breath.  Having a large amount of saliva.  Having an upset or bloated stomach.  Belching.  Chest pain.  Shortness of  breath or wheezing.  Ongoing (chronic) cough or a night-time cough.  Wearing away of tooth enamel.  Weight loss. Different conditions can cause chest pain. Make sure to see your health care provider if you experience chest pain. How is this diagnosed? Your health care provider will take a medical history and perform a physical exam. To determine if you have mild or severe GERD, your health care provider may also monitor how you respond to treatment. You may also have other tests, including:  An endoscopy toexamine your stomach and esophagus with a small camera.  A test thatmeasures the acidity level in your esophagus.  A test thatmeasures how much pressure is on your esophagus.  A barium swallow or modified barium swallow to show the shape, size, and functioning of your esophagus. How is this treated? The goal of treatment is to help relieve your symptoms and to prevent complications. Treatment for this condition may vary depending on how severe your symptoms are. Your health care provider may recommend:  Changes to your diet.  Medicine.  Surgery. Follow these instructions at home: Diet  Follow a diet as recommended by your health care provider. This may involve avoiding foods and drinks such as:  Coffee and tea (with or without caffeine).  Drinks that containalcohol.  Energy drinks and sports drinks.  Carbonated drinks or sodas.  Chocolate and cocoa.  Peppermint and mint flavorings.  Garlic and onions.  Horseradish.  Spicy and acidic foods, including peppers, chili powder, curry powder, vinegar, hot sauces, and barbecue sauce.  Citrus fruit juices and citrus fruits, such as oranges, lemons, and limes.  Tomato-based foods, such as red sauce, chili, salsa, and pizza with red sauce.  Fried and fatty foods, such as donuts, french fries, potato chips, and high-fat dressings.  High-fat meats, such as hot dogs and fatty cuts of red and white meats, such as rib eye  steak, sausage, ham, and bacon.  High-fat dairy items, such as whole milk, butter, and cream cheese.  Eat small, frequent meals instead of large meals.  Avoid drinking large amounts of liquid with your meals.  Avoid eating meals during the 2-3 hours before bedtime.  Avoid lying down right after you eat.  Do not exercise right after you eat. General instructions  Pay attention to any changes in your symptoms.  Take over-the-counter and prescription medicines only as told by your health care provider. Do not take aspirin, ibuprofen, or other NSAIDs unless your health care provider told you to do so.  Do not use any tobacco products, including cigarettes, chewing tobacco, and e-cigarettes. If you need help quitting, ask your health care provider.  Wear loose-fitting clothing. Do not wear anything tight around your waist that causes pressure on your abdomen.  Raise (elevate) the head of your bed 6 inches (15cm).  Try to reduce your stress, such as with yoga or meditation. If you need help reducing stress, ask your health care provider.  If you are overweight, reduce your weight to an amount that is healthy for you. Ask your health care provider for guidance about a safe weight loss goal.  Keep all follow-up visits as told by your health care provider. This is important. Contact a health care provider if:  You have new symptoms.  You have unexplained weight loss.  You have difficulty swallowing, or it hurts to swallow.  You have wheezing or a persistent cough.  Your symptoms do not improve with treatment.  You have a hoarse voice. Get help right away if:  You have pain in your arms, neck, jaw, teeth, or back.  You feel sweaty, dizzy, or light-headed.  You have chest pain or shortness of breath.  You vomit and your vomit looks like blood or coffee grounds.  You faint.  Your stool is bloody or black.  You cannot swallow, drink, or eat. This information is not  intended to replace advice given to you by your health care provider. Make sure you discuss any questions you have with your health care provider. Document Released: 01/14/2005 Document Revised: 09/04/2015 Document Reviewed: 08/01/2014 Elsevier Interactive Patient Education  2017 [employer].   Colon Polyps Introduction Polyps are tissue growths inside the body. Polyps can grow in many places, including the large intestine (colon). A polyp may be a round bump or a mushroom-shaped growth. You could have one polyp or several. Most colon polyps are noncancerous (benign). However, some colon polyps can become cancerous over time. What are the causes? The exact cause of colon polyps is not known. What increases the risk? This  condition is more likely to develop in people who:  Have a family history of colon cancer or colon polyps.  Are older than 42 or older than 45 if they are African American.  Have inflammatory bowel disease, such as ulcerative colitis or Crohn disease.  Are overweight.  Smoke cigarettes.  Do not get enough exercise.  Drink too much alcohol.  Eat a diet that is:  High in fat and red meat.  Low in fiber.  Had childhood cancer that was treated with abdominal radiation. What are the signs or symptoms? Most polyps do not cause symptoms. If you have symptoms, they may include:  Blood coming from your rectum when having a bowel movement.  Blood in your stool.The stool may look dark red or black.  A change in bowel habits, such as constipation or diarrhea. How is this diagnosed? This condition is diagnosed with a colonoscopy. This is a procedure that uses a lighted, flexible scope to look at the inside of your colon. How is this treated? Treatment for this condition involves removing any polyps that are found. Those polyps will then be tested for cancer. If cancer is found, your health care provider will talk to you about options for colon cancer  treatment. Follow these instructions at home: Diet  Eat plenty of fiber, such as fruits, vegetables, and whole grains.  Eat foods that are high in calcium and vitamin D, such as milk, cheese, yogurt, eggs, liver, fish, and broccoli.  Limit foods high in fat, red meats, and processed meats, such as hot dogs, sausage, bacon, and lunch meats.  Maintain a healthy weight, or lose weight if recommended by your health care provider. General instructions  Do not smoke cigarettes.  Do not drink alcohol excessively.  Keep all follow-up visits as told by your health care provider. This is important. This includes keeping regularly scheduled colonoscopies. Talk to your health care provider about when you need a colonoscopy.  Exercise every day or as told by your health care provider. Contact a health care provider if:  You have new or worsening bleeding during a bowel movement.  You have new or increased blood in your stool.  You have a change in bowel habits.  You unexpectedly lose weight. This information is not intended to replace advice given to you by your health care provider. Make sure you discuss any questions you have with your health care provider. Document Released: 01/01/2004 Document Revised: 09/12/2015 Document Reviewed: 02/25/2015  2017 Elsevier   Diverticulosis Diverticulosis is the condition that develops when small pouches (diverticula) form in the wall of your colon. Your colon, or large intestine, is where water is absorbed and stool is formed. The pouches form when the inside layer of your colon pushes through weak spots in the outer layers of your colon. CAUSES  No one knows exactly what causes diverticulosis. RISK FACTORS  Being older than 45. Your risk for this condition increases with age. Diverticulosis is rare in people younger than 40 years. By age 43, almost everyone has it.  Eating a low-fiber diet.  Being frequently constipated.  Being  overweight.  Not getting enough exercise.  Smoking.  Taking over-the-counter pain medicines, like aspirin and ibuprofen. SYMPTOMS  Most people with diverticulosis do not have symptoms. DIAGNOSIS  Because diverticulosis often has no symptoms, health care providers often discover the condition during an exam for other colon problems. In many cases, a health care provider will diagnose diverticulosis while using a flexible scope to examine  the colon (colonoscopy). TREATMENT  If you have never developed an infection related to diverticulosis, you may not need treatment. If you have had an infection before, treatment may include:  Eating more fruits, vegetables, and grains.  Taking a fiber supplement.  Taking a live bacteria supplement (probiotic).  Taking medicine to relax your colon. HOME CARE INSTRUCTIONS   Drink at least 6-8 glasses of water each day to prevent constipation.  Try not to strain when you have a bowel movement.  Keep all follow-up appointments. If you have had an infection before:  Increase the fiber in your diet as directed by your health care provider or dietitian.  Take a dietary fiber supplement if your health care provider approves.  Only take medicines as directed by your health care provider. SEEK MEDICAL CARE IF:   You have abdominal pain.  You have bloating.  You have cramps.  You have not gone to the bathroom in 3 days. SEEK IMMEDIATE MEDICAL CARE IF:   Your pain gets worse.  Yourbloating becomes very bad.  You have a fever or chills, and your symptoms suddenly get worse.  You begin vomiting.  You have bowel movements that are bloody or black. MAKE SURE YOU:  Understand these instructions.  Will watch your condition.  Will get help right away if you are not doing well or get worse. This information is not intended to replace advice given to you by your health care provider. Make sure you discuss any questions you have with your  health care provider. Document Released: 01/02/2004 Document Revised: 04/11/2013 Document Reviewed: 03/01/2013 Elsevier Interactive Patient Education  2017 [employer].  GERD information provided  Colon polyp and diverticulosis information provided  Begin Protonix 40 mg twice daily  Further recommendations to follow pending review of pathology report  Office visit with [place] in 3 months

## 2016-04-24 NOTE — Anesthesia Preprocedure Evaluation (Signed)
Anesthesia Evaluation  Patient identified by MRN, date of birth, ID band Patient awake    Reviewed: Allergy & Precautions, NPO status , Patient's Chart, lab work & pertinent test results  Airway Mallampati: II  TM Distance: >3 FB     Dental  (+) Edentulous Upper, Edentulous Lower   Pulmonary COPD, Current Smoker,    breath sounds clear to auscultation       Cardiovascular + dysrhythmias  Rhythm:Regular Rate:Normal     Neuro/Psych PSYCHIATRIC DISORDERS Bipolar Disorder    GI/Hepatic melena   Endo/Other    Renal/GU      Musculoskeletal   Abdominal   Peds  Hematology   Anesthesia Other Findings   Reproductive/Obstetrics                             Anesthesia Physical Anesthesia Plan  ASA: III  Anesthesia Plan: MAC   Post-op Pain Management:    Induction: Intravenous  Airway Management Planned: Simple Face Mask  Additional Equipment:   Intra-op Plan:   Post-operative Plan:   Informed Consent: I have reviewed the patients History and Physical, chart, labs and discussed the procedure including the risks, benefits and alternatives for the proposed anesthesia with the patient or authorized representative who has indicated his/her understanding and acceptance.     Plan Discussed with:   Anesthesia Plan Comments:         Anesthesia Quick Evaluation

## 2016-04-24 NOTE — Op Note (Signed)
[facility] Patient Name: Tiffany Novak Procedure Date: 04/24/2016 12:25 PM MRN: [id number] Date of Birth: Nov 03, 1970 Attending MD: Norvel Richards , MD CSN: [id number] Age: 46 Admit Type: Outpatient Procedure:                Upper GI endoscopy - diagnosttic Indications:              Melena Providers:                Norvel Richards, MD, Lurline Del, RN, Purcell Nails.                            [place], Technician, Aram Candela Referring MD:              Medicines:                Propofol per Anesthesia Complications:            No immediate complications. Estimated Blood Loss:     Estimated blood loss: none. Procedure:                Pre-Anesthesia Assessment:                           - Prior to the procedure, a History and Physical                            was performed, and patient medications and                            allergies were reviewed. The patient's tolerance of                            previous anesthesia was also reviewed. The risks                            and benefits of the procedure and the sedation                            options and risks were discussed with the patient.                            All questions were answered, and informed consent                            was obtained. ASA Grade Assessment: III - A patient                            with severe systemic disease. After reviewing the                            risks and benefits, the patient was deemed in                            satisfactory condition to undergo the procedure.                           -  Prior to the procedure, a History and Physical                            was performed, and patient medications and                            allergies were reviewed. The patient's tolerance of                            previous anesthesia was also reviewed. The risks                            and benefits of the procedure and the sedation                            options  and risks were discussed with the patient.                            All questions were answered, and informed consent                            was obtained. ASA Grade Assessment: II - A patient                            with mild systemic disease. After reviewing the                            risks and benefits, the patient was deemed in                            satisfactory condition to undergo the procedure.                           After obtaining informed consent, the endoscope was                            passed under direct vision. Throughout the                            procedure, the patient's blood pressure, pulse, and                            oxygen saturations were monitored continuously.The                            patient tolerated the procedure well. The EG-[id number]                            [id number]) scope was introduced through the mouth,                            and advanced to the second part of duodenum. Scope In: 12:25:49 PM Scope Out: 12:30:02 PM Total Procedure Duration: 0 hours  4 minutes 13 seconds  Findings:      LA Grade C (one or more mucosal breaks continuous between tops of 2 or       more mucosal folds, less than 75% circumference) esophagitis was found       34 to 39 cm from the incisors. No esophageal varices. No Barrett's       epithelium seen.      A small hiatal hernia was present.      The cardia and gastric fundus were normal on retroflexion.      The duodenal bulb and second portion of the duodenum were normal. Impression:               Normal gastric mucosa.                           - LA Grade C esophagitis.                           - Small hiatal hernia.                           - Normal duodenal bulb and second portion of the                            duodenum.                           - No specimens collected. Moderate Sedation:      Moderate (conscious) sedation was personally administered by an       anesthesia  professional. The following parameters were monitored: oxygen       saturation, heart rate, blood pressure, respiratory rate, EKG, adequacy       of pulmonary ventilation, and response to care. Total physician       intraservice time was 20 minutes. Recommendation:           - Patient has a contact number available for                            emergencies. The signs and symptoms of potential                            delayed complications were discussed with the                            patient. Return to normal activities tomorrow.                            Written discharge instructions were provided to the                            patient.                           - Resume previous diet.                           - Continue present medications. Begin Protonix 40  mg twice daily                           - Await pathology results.                           - No repeat upper endoscopy.                           - Return to GI office in 3 months. See colonoscopy                            report. Procedure Code(s):        --- Professional ---                           [phone], Esophagogastroduodenoscopy, flexible,                            transoral; diagnostic, including collection of                            specimen(s) by brushing or washing, when performed                            (separate procedure) Diagnosis Code(s):        --- Professional ---                           K20.9, Esophagitis, unspecified                           K44.9, Diaphragmatic hernia without obstruction or                            gangrene                           K92.1, Melena (includes Hematochezia) CPT copyright 2016 American Medical Association. All rights reserved. The codes documented in this report are preliminary and upon coder review may  be revised to meet current compliance requirements. Cristopher Estimable. Rourk, MD Norvel Richards, MD 04/24/2016 3:53:07  PM This report has been signed electronically. Number of Addenda: 0

## 2016-04-26 NOTE — Op Note (Signed)
[employer] Patient Name: Tiffany Novak Procedure Date: 04/24/2016 12:32 PM MRN: [id number] Date of Birth: 06/08/1970 Attending MD: Norvel Richards , MD CSN: [id number] Age: 46 Admit Type: Outpatient Procedure:                Colonoscopy with snare polypectomy Indications:              Hematochezia Providers:                Norvel Richards, MD, Lurline Del, RN, [facility], Technician, Aram Candela Referring MD:              Medicines:                Propofol per Anesthesia Complications:            No immediate complications. Estimated Blood Loss:     Estimated blood loss was minimal. Procedure:                Pre-Anesthesia Assessment:                           - Prior to the procedure, a History and Physical                            was performed, and patient medications and                            allergies were reviewed. The patient's tolerance of                            previous anesthesia was also reviewed. The risks                            and benefits of the procedure and the sedation                            options and risks were discussed with the patient.                            All questions were answered, and informed consent                            was obtained. ASA Grade Assessment: III - A patient                            with severe systemic disease. After reviewing the                            risks and benefits, the patient was deemed in                            satisfactory condition to undergo the procedure.  After obtaining informed consent, the colonoscope                            was passed under direct vision. Throughout the                            procedure, the patient's blood pressure, pulse, and                            oxygen saturations were monitored continuously. The                            [id number] scope was introduced through               the and advanced to the the cecum, identified by                            appendiceal orifice and ileocecal valve. The                            colonoscopy was performed without difficulty. The                            patient tolerated the procedure well. The quality                            of the bowel preparation was adequate. The                            ileocecal valve, appendiceal orifice, and rectum                            were photographed. Scope In: 12:36:51 PM Scope Out: 12:51:03 PM Total Procedure Duration: 0 hours 14 minutes 12 seconds  Findings:      The perianal and digital rectal examinations were normal.      A few diverticula were found in the entire colon.      Two semi-pedunculated polyps were found in the sigmoid colon. The polyps       were 4 to 6 mm in size. These polyps were removed with a cold snare.       Resection and retrieval were complete. Estimated blood loss was minimal.      The exam was otherwise without abnormality on direct and retroflexion       views. Impression:               - Diverticulosis in the entire examined colon.                           - Two 4 to 6 mm polyps in the sigmoid colon,                            removed with a cold snare. Resected and retrieved.                           -  The examination was otherwise normal on direct                            and retroflexion views. Moderate Sedation:      Moderate (conscious) sedation was personally administered by an       anesthesia professional. The following parameters were monitored: oxygen       saturation, heart rate, blood pressure, and response to care. Total       physician intraservice time was 41 minutes. Recommendation:           - Patient has a contact number available for                            emergencies. The signs and symptoms of potential                            delayed complications were discussed with the                             patient. Return to normal activities tomorrow.                            Written discharge instructions were provided to the                            patient.                           - Resume previous diet.                           - Continue present medications.                           - Repeat colonoscopy date to be determined after                            pending pathology results are reviewed for                            surveillance based on pathology results.                           - Return to GI office in 3 months. See colonoscopy                            report. Procedure Code(s):        --- Professional ---                           [phone], Colonoscopy, flexible; with removal of                            tumor(s), polyp(s), or other lesion(s) by snare  technique Diagnosis Code(s):        --- Professional ---                           D12.5, Benign neoplasm of sigmoid colon                           K92.1, Melena (includes Hematochezia)                           K57.30, Diverticulosis of large intestine without                            perforation or abscess without bleeding CPT copyright 2016 American Medical Association. All rights reserved. The codes documented in this report are preliminary and upon coder review may  be revised to meet current compliance requirements. Cristopher Estimable. Emanuel Campos, MD Norvel Richards, MD 04/26/2016 10:32:11 AM This report has been signed electronically. Number of Addenda: 0

## 2016-04-26 NOTE — H&P (Signed)
HPI:   Tiffany Novak is a 46 y.o. female who presents on referral from primary care due to alcohol dependence, questionable liver damage, elevated LFTs, low magnesium and folate, never been to GI rather colonoscopy. The patient last saw primary care on 07/25/2015 at which point noted for the previous 2 weeks she was having fecal urgency with stools ranging from soft to diarrhea. Has never had colonoscopy before. Negative stool specimen. She was given a course of dicyclomine. Labs provided include CMP dated 03/04/2016 which show elevated alkaline phosphatase at 151, elevated AST/ALT 123/53, normal albumin at 3.79, CBC with a normal platelet count of 169, vitamin [id number] normal, folic acid low at 4.2. TSH normal, vitamin D normal, magnesium low at 1.38, hepatitis C antibody negative. Previous liver functions were normal in 2016.  Today she states she is tired a lot overall. Poor energy, sleeps a lot. Has RUQ abdominal pain and epigastric pain typically when vomiting and sharp. RUQ pain described as dull pain, intermittent, occurs about 3-4 times a day and lasts 10 min to 30 min, self-resolved typically with laying down and changing position. She has had people tell her her skin/eyes have yellowed, but her mom states she has never seen this. Denies darkened urine. Denies LE edema, abdominal swelling. Has had 1-2 episodes of rectal bleeding/brbpr last noted 2 weeks ago, 1-2 episodes then resolves. This was the only occurrence. Has noted dark, tarry stools. Denies chest pain, dyspnea, dizziness, lightheadedness, syncope, near syncope. Denies any other upper or lower GI symptoms.  Symptoms have not changed very much since she was seen in the office on 03/25/2016.  Denies NSAIDs and ASA powders.      Past Medical History:  Diagnosis Date  . Anxiety and depression   . Bipolar disorder ([place])   . Borderline personality disorder   . COPD (chronic obstructive pulmonary disease) ([place])   . Fatigue   . OAB  (overactive bladder)   . PTSD (post-traumatic stress disorder)     No past surgical history on file.        Current Outpatient Prescriptions  Medication Sig Dispense Refill  . diazepam (VALIUM) 10 MG tablet Take 10 mg by mouth 3 (three) times daily as needed.    . Prenatal Vit-Fe Fumarate-FA (PNV PRENATAL PLUS MULTIVITAMIN) 27-1 MG TABS Take 1 tablet by mouth daily.    Marland Kitchen topiramate (TOPAMAX) 50 MG tablet Take 50 mg by mouth at bedtime.    . TRINTELLIX 10 MG TABS Take 10 mg by mouth daily.    . polyethylene glycol-electrolytes (TRILYTE) 420 g solution Take 4,000 mLs by mouth as directed. 4000 mL 0   No current facility-administered medications for this visit.        Allergies as of 03/25/2016  . (No Known Allergies)         Family History  Problem Relation Age of Onset  . Colon cancer Neg Hx   . Liver disease Neg Hx     Social History        Social History  . Marital status: Married    Spouse name: N/A  . Number of children: N/A  . Years of education: N/A      Occupational History  . Not on file.         Social History Main Topics  . Smoking status: Current Every Day Smoker    Packs/day: 1.00  . Smokeless tobacco: Never Used  . Alcohol use No     Comment: None since 03/22/16; Has  been cutting back for 3 months (12/2015). Previously significant liquor use ($600/month worth)  . Drug use: No  . Sexual activity: Not on file       Other Topics Concern  . Not on file      Social History Narrative  . No narrative on file    Review of Systems: General: Negative for anorexia, weight loss, fever, chills, weakness. Eyes: Negative for vision changes.  ENT: Negative for hoarseness, difficulty swallowing. CV: Negative for chest pain, angina, palpitations, peripheral edema.  Respiratory: Negative for dyspnea at rest, cough, sputum, wheezing.  GI: See history of present illness. MS: Negative for joint pain, low back pain.  Derm:  Negative for rash or itching.  Neuro: Negative for memory loss, confusion.  Endo: Negative for unusual weight change.  Heme: Negative for bruising or bleeding.    Physical Exam:  kg/m  General:   Alert and oriented. Pleasant and cooperative. Well-nourished and well-developed.  Respiratory:  Clear to auscultation bilaterally. No wheezes, rales, or rhonchi. No distress.  Gastrointestinal:  +BS, soft, and non-distended. No HSM noted. No guarding or rebound. No masses appreciated.  Rectal:  Deferred  Musculoskalatal:  Symmetrical without gross deformities.  Neurologic:  Alert and oriented x4;  grossly normal neurologically. Psych:  Alert and cooperative. Normal mood and affect. Heme/Lymph/Immune: No excessive bruising noted.     Impression:   Pleasant 46 year old lady with melena and hematochezia. Details of her recent presentation are well chronicled in our 03/25/2016 office visit consultation note.   Recommendations: I have offered patient both an EGD and colonoscopy under deep sedation with the help of Dr. Patsey Berthold to further evaluate her symptoms. The risks, benefits, limitations, imponderables and alternatives regarding both EGD and colonoscopy have been reviewed with the patient. Questions have been answered. All parties agreeable.

## 2016-05-01 ENCOUNTER — Encounter (HOSPITAL_COMMUNITY): Payer: Self-pay | Admitting: Internal Medicine

## 2016-05-02 ENCOUNTER — Encounter: Payer: Self-pay | Admitting: Internal Medicine

## 2016-05-02 NOTE — Progress Notes (Signed)
.  rmr 

## 2016-05-19 ENCOUNTER — Telehealth: Payer: Self-pay | Admitting: Internal Medicine

## 2016-05-19 NOTE — Telephone Encounter (Signed)
Spoke with the pt, she said she wants to keep her appt on 05/26/16.   Pt also wants to know if it would be a good idea for her to take vitamin B1. 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 She also wanted to know if it would be cheaper to get it otc or rx, if she should take it?

## 2016-05-19 NOTE — Telephone Encounter (Signed)
[phone]  PLEASE CALL PATIENT, SHE HAS QUESTIONS ABOUT MEDICATION

## 2016-05-20 NOTE — Telephone Encounter (Signed)
Tried to call pt- NA- LMOM with recommendations.  

## 2016-05-20 NOTE — Telephone Encounter (Signed)
I have not seen any research (after digging around) about B1 and fibrosis (does she mean B12?). Generally you can take a multivitamin or other specific vitamins, if you are deficient. No vitamin intake or supplement can reverse fibrosis. The best thing at this point to prevent further fibrosis is avoiding substances harmful to the liver (alcohol, drugs, large doses of Tylenol, etc).  If she feels she may be vitamin deficient we can check some bloodwork.

## 2016-05-21 NOTE — Telephone Encounter (Signed)
Noted, no further recommendations. We can discuss any possible vitamin deficiency testing at her next visit.

## 2016-05-21 NOTE — Telephone Encounter (Signed)
Pt called back and left a voicemail. She said she was going to take B1 for now but if EG felt like she needed to be tested for vitamin deficiencies then she would have the blood work done.

## 2016-05-26 ENCOUNTER — Ambulatory Visit (INDEPENDENT_AMBULATORY_CARE_PROVIDER_SITE_OTHER): Payer: BLUE CROSS/BLUE SHIELD | Admitting: Nurse Practitioner

## 2016-05-26 ENCOUNTER — Encounter: Payer: Self-pay | Admitting: Nurse Practitioner

## 2016-05-26 VITALS — BP 131/84 | HR 114 | Temp 97.3°F | Ht 62.0 in | Wt 133.4 lb

## 2016-05-26 DIAGNOSIS — K7460 Unspecified cirrhosis of liver: Secondary | ICD-10-CM | POA: Insufficient documentation

## 2016-05-26 DIAGNOSIS — Z87898 Personal history of other specified conditions: Secondary | ICD-10-CM | POA: Diagnosis not present

## 2016-05-26 DIAGNOSIS — K7030 Alcoholic cirrhosis of liver without ascites: Secondary | ICD-10-CM | POA: Diagnosis not present

## 2016-05-26 DIAGNOSIS — F1011 Alcohol abuse, in remission: Secondary | ICD-10-CM

## 2016-05-26 NOTE — Patient Instructions (Signed)
1. Continue your efforts to not drink any alcohol. 2. We will work on sending notes to psychiatry. 3. Return for follow-up in 4 months to update your imaging and labs. 4. Call us if you have any new, worsening, recurrent symptoms before then.

## 2016-05-26 NOTE — Assessment & Plan Note (Signed)
Has done relatively well abstaining from alcohol but has had a few slip ups in the past couple months. No alcohol in 1 month at this time. Continue to abstain from alcohol, we will send notes to psychiatry was requesting them in order to continue to refill her medications, return for follow-up in 4 months.

## 2016-05-26 NOTE — Progress Notes (Signed)
Referring Provider: Alanda Amass, MD Primary Care Physician:  Alanda Amass, MD Primary GI:  Dr. Gala Romney  Chief Complaint  Patient presents with  . Elevated LFT'S    HPI:   Tiffany Novak is a 46 y.o. female who presents for follow-up on abnormal labs in bowel habit changes as well as post colonoscopy follow-up. The patient was last seen in our office 03/25/2016 for elevated LFTs, rectal bleeding, history of alcohol abuse, dark stools. At her last visit she noted history of fecal urgency and the previous 2 weeks from soft to diarrhea, no colonoscopy previously, negative stool studies. Given course of dicyclomine. Noted elevated LFTs including alkaline phosphatase at 151, AST/ALT at 123/53, platelet count normal at 169. At time of her last visit she noted to be tired a lot overall, poor energy, sleeps a lot. Right upper quadrant pain typically when vomiting described as dull, intermittent, 3-4 times a day lasting 10-30 minutes which resolves on its own. Feels she may have had jaundice intermittently noted dark tarry stools as well as rectal bleeding most recently 2 weeks prior. Noted to 3 months of alcohol consumption reduction and at her last visit noted she not had any alcohol in the previous 3 days and was committed to abstaining. Labs are ordered including CMP, CBC, autoimmune, hepatitis B serologies. Schedule ultrasound of the liver, recommended colonoscopy and upper endoscopy on propofol/MAC.  Labs found normal platelet count 168,no anemia or leukocytosis.CMP found elevated alkaline phosphatase at 128, AST still elevated but improved at 97, ALT normal, albumin normal, hepatitis B serology negative, autoimmune labs normal.overall elevated LFTs likely due to alcohol consumption which were improved on follow-up. Ultrasound elastography found some F3 and F4 with high risk of fibrosis.  EGD completed 04/24/2016 which found LA grade C esophagitis, no esophageal varices, no Barrett's epithelium,  small hiatal hernia, normal gastric mucosa, normal duodenum. Colonoscopy completed the same day found diverticulosis in the entire examined colon,two 4-6 mm polyps in the sigmoid colon status post removal, otherwise normal. Recommended Protonix 40 mg twice daily, no repeat upper endoscopy, follow-up in 3 months as outpatient. Surgical pathology as per below. Recommended 10 year repeat screening colonoscopy.  Today she states she's doing ok. She has good and bad days with frustrations and "putting up with people." States "when I get frustrated, it isn't good for me." She's had a couple slip ups since her last visit with 2-3 airplane bottles in a day, three days since her last visit. Her last slip-up was 04/25/16; no ETOH in a month. Denies abdominal pain, N/V, hematochezia, melena, fever, chills, yellowing of skin/eyes, unintentional weight loss, acute episodic confusion. Has tremors/shakes after drinking. Her psychiatrist is asking for records from our office to document she has been to [place] and the results of workup; she has signed the release already with our front desk. Denies chest pain, dyspnea, dizziness, lightheadedness, syncope, near syncope. Denies any other upper or lower GI symptoms.    Past Medical History:  Diagnosis Date  . Anxiety and depression   . Bipolar disorder ([place])   . Borderline personality disorder   . COPD (chronic obstructive pulmonary disease) ([place])   . Dysrhythmia    sts "I have an irregular heartbeat", no meds and not sure what type.  . Fatigue   . OAB (overactive bladder)   . PTSD (post-traumatic stress disorder)     Past Surgical History:  Procedure Laterality Date  . COLONOSCOPY WITH PROPOFOL N/A 04/24/2016   Procedure: COLONOSCOPY WITH  PROPOFOL;  Surgeon: Daneil Dolin, MD;  Location: [facility];  Service: Endoscopy;  Laterality: N/A;  1200  . ESOPHAGOGASTRODUODENOSCOPY (EGD) WITH PROPOFOL N/A 04/24/2016   Procedure: ESOPHAGOGASTRODUODENOSCOPY (EGD) WITH  PROPOFOL;  Surgeon: Daneil Dolin, MD;  Location: [facility];  Service: Endoscopy;  Laterality: N/A;  . TUBAL LIGATION      Current Outpatient Prescriptions  Medication Sig Dispense Refill  . diazepam (VALIUM) 10 MG tablet Take 10 mg by mouth every 8 (eight) hours.     . fluticasone furoate-vilanterol (BREO ELLIPTA) 100-25 MCG/INH AEPB Inhale 1 puff into the lungs daily.    Marland Kitchen ibuprofen (ADVIL,MOTRIN) 200 MG tablet Take 400 mg by mouth every 8 (eight) hours as needed (for headaches.).    Marland Kitchen Prenatal Vit-Fe Fumarate-FA (PNV PRENATAL PLUS MULTIVITAMIN) 27-1 MG TABS Take 1 tablet by mouth daily.    Marland Kitchen topiramate (TOPAMAX) 50 MG tablet Take 50 mg by mouth at bedtime.    . traZODone (DESYREL) 100 MG tablet Take 100 mg by mouth at bedtime as needed for sleep.    . TRINTELLIX 20 MG TABS Take 20 mg by mouth daily at 12 noon.  0   No current facility-administered medications for this visit.     Allergies as of 05/26/2016  . (No Known Allergies)    Family History  Problem Relation Age of Onset  . Colon cancer Neg Hx   . Liver disease Neg Hx     Social History   Social History  . Marital status: Married    Spouse name: N/A  . Number of children: N/A  . Years of education: N/A   Social History Main Topics  . Smoking status: Current Every Day Smoker    Packs/day: 1.00    Years: 30.00  . Smokeless tobacco: Never Used  . Alcohol use No     Comment: Evaluated 05/26/16: None since 04/25/16; Has been cutting back for 3 months (12/2015). Previously significant liquor use ($600/month worth)  . Drug use: No  . Sexual activity: Not Currently    Birth control/ protection: Surgical   Other Topics Concern  . None   Social History Narrative  . None    Review of Systems: General: Negative for anorexia, weight loss, fever, chills, fatigue, weakness. Eyes: Negative for vision changes.  ENT: Negative for hoarseness, difficulty swallowing , nasal congestion. CV: Negative for chest pain, angina,  palpitations, dyspnea on exertion, peripheral edema.  Respiratory: Negative for dyspnea at rest, dyspnea on exertion, cough, sputum, wheezing.  GI: See history of present illness. GU:  Negative for dysuria, hematuria, urinary incontinence, urinary frequency, nocturnal urination.  MS: Negative for joint pain, low back pain.  Derm: Negative for rash or itching.  Neuro: Negative for weakness, abnormal sensation, seizure, frequent headaches, memory loss, confusion.  Psych: Negative for anxiety, depression, suicidal ideation, hallucinations.  Endo: Negative for unusual weight change.  Heme: Negative for bruising or bleeding. Allergy: Negative for rash or hives.   Physical Exam: BP 131/84   Pulse (!) 114   Temp 97.3 F (36.3 C) (Oral)   Ht 5' 2"  (1.575 m)   Wt 133 lb 6.4 oz (60.5 kg)   BMI 24.40 kg/m  General:   Alert and oriented. Pleasant and cooperative. Well-nourished and well-developed.  Head:  Normocephalic and atraumatic. Eyes:  Without icterus, sclera clear and conjunctiva pink.  Ears:  Normal auditory acuity. Mouth:  No deformity or lesions, oral mucosa pink.  Throat/Neck:  Supple, without mass or thyromegaly. Cardiovascular:  S1, S2 present without murmurs appreciated. Normal pulses noted. Extremities without clubbing or edema. Respiratory:  Clear to auscultation bilaterally. No wheezes, rales, or rhonchi. No distress.  Gastrointestinal:  +BS, soft, non-tender and non-distended. No HSM noted. No guarding or rebound. No masses appreciated.  Rectal:  Deferred  Musculoskalatal:  Symmetrical without gross deformities. Normal posture. Skin:  Intact without significant lesions or rashes. Neurologic:  Alert and oriented x4;  grossly normal neurologically. Psych:  Alert and cooperative. Normal mood and affect. Heme/Lymph/Immune: No significant cervical adenopathy. No excessive bruising noted.    05/26/2016 12:02 PM   Disclaimer: This note was dictated with voice recognition  software. Similar sounding words can inadvertently be transcribed and may not be corrected upon review.

## 2016-05-26 NOTE — Assessment & Plan Note (Signed)
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 Her labs are up-to-date, autoimmune serologies, hepatitis viral serologies up-to-date. Her etiology of her liver disease is likely alcohol. She has done relatively well and a day and but is had 3 slip ups since her last visit 2 months ago. Each slip up entails 2-3 airplane bottles of liquor and one day, 3 total days since last seen. Her last slip of was about one month ago. We discussed treatment options however she cares for her husband and is unable to participate in a treatment program. Recommend continued efforts to abstain from alcohol. Return for follow-up in 4 months to update imaging and labs.

## 2016-05-28 NOTE — Progress Notes (Signed)
cc'ed to pcp °

## 2016-05-29 ENCOUNTER — Telehealth: Payer: Self-pay | Admitting: Nurse Practitioner

## 2016-05-29 NOTE — Telephone Encounter (Signed)
Called patient related to page for callback. Patient noted coughing up blood and was concerned. Last EGD 1 month ago with no varices, hgb 18.2, grade C esophagitis. Liver appears well compensated. States she is definitely not vomiting blood but coughing blood. States she's been coughing a lot lately ("I think it's from the smoking"). Likely bronchial irritation with mild bleeding. Denies worsening chest pain, dizziness, syncope, near syncope, worsening dyspnea. Recommended she continue to monitor and if she notes persistent significant bleeding or any changes to suggest significant blood loss (discussed with her) to present to the ER. Verbalized understanding.  Note copied to Dr. Gala Romney as Juluis Rainier

## 2016-05-30 NOTE — Telephone Encounter (Signed)
Communication noted; thanks

## 2016-06-19 ENCOUNTER — Other Ambulatory Visit: Payer: Self-pay

## 2016-06-22 ENCOUNTER — Other Ambulatory Visit: Payer: Self-pay

## 2016-06-23 MED ORDER — PANTOPRAZOLE SODIUM 40 MG PO TBEC: 40.0000 mg | DELAYED_RELEASE_TABLET | Freq: Two times a day (BID) | ORAL | 3 refills | Status: AC

## 2016-09-23 ENCOUNTER — Telehealth: Payer: Self-pay | Admitting: Nurse Practitioner

## 2016-09-23 ENCOUNTER — Ambulatory Visit: Payer: BLUE CROSS/BLUE SHIELD | Admitting: Nurse Practitioner

## 2016-09-23 ENCOUNTER — Encounter: Payer: Self-pay | Admitting: Nurse Practitioner

## 2016-09-23 NOTE — Telephone Encounter (Signed)
PATIENT WAS A NO SHOW AND LETTER SENT  °

## 2016-09-23 NOTE — Telephone Encounter (Signed)
Noted  

## 2016-09-23 NOTE — Progress Notes (Deleted)
Referring Provider: Alanda Amass, MD Primary Care Physician:  Alanda Amass, MD Primary GI:  Dr. Gala Romney  No chief complaint on file.   HPI:   Tiffany Novak is a 46 y.o. 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 The patient was last seen in our office 05/26/2016 for the same. Did have liver enzyme elevations and chronic fatigue. Liver workup was performed prior to her last visit which found a platelet count of 168, no anemia, no leukocytosis, CMP with alkaline phosphatase of 128, AST still elevated and improved and 97, ALT normal, negative hepatitis B. Elevated LFTs due likely to alcohol consumption, ultrasound elastography found some S3 and S4 with high risk of fibrosis. EGD completed 05/14/2016 with LA grade C esophagitis, no varices, no Barrett's, small hiatal hernia, normal gastric mucosa and duodenum. Colonoscopy completed the same day found diverticulosis in the colon,two 4 mm to 6 mm polyps in the sigmoid colon status post removal, recommended Protonix 40 mg twice daily, no repeat upper endoscopy, follow-up in 3 months as outpatient. Surgical pathology found polyps to be hyperplastic. Recommended 10 year repeat colonoscopy. At her last visit she was doing okay, some bad days of frustration and putting out with people. She admits a couple slip ups since her last visit was 2-3 airplane bottles and a, 3 times since her last visit. Last alcohol consumption 04/25/2016, no alcohol use in a month. Has tremor/aches after drinking. Is seeing psychiatry who is asking for records or office document she has to see [place] in the results of workup. No other GI symptoms. Recommended continue efforts to quit drinking, we will send her to psychiatry, return for follow-up in 4 months to update imaging and labs, call us with any worsening symptoms.  Today she states   Past Medical History:  Diagnosis Date  . Anxiety and depression   . Bipolar disorder ([place])   . Borderline personality  disorder   . COPD (chronic obstructive pulmonary disease) ([place])   . Dysrhythmia    sts "I have an irregular heartbeat", no meds and not sure what type.  . Fatigue   . OAB (overactive bladder)   . PTSD (post-traumatic stress disorder)     Past Surgical History:  Procedure Laterality Date  . COLONOSCOPY WITH PROPOFOL N/A 04/24/2016   Procedure: COLONOSCOPY WITH PROPOFOL;  Surgeon: Daneil Dolin, MD;  Location: [facility];  Service: Endoscopy;  Laterality: N/A;  1200  . ESOPHAGOGASTRODUODENOSCOPY (EGD) WITH PROPOFOL N/A 04/24/2016   Procedure: ESOPHAGOGASTRODUODENOSCOPY (EGD) WITH PROPOFOL;  Surgeon: Daneil Dolin, MD;  Location: [facility];  Service: Endoscopy;  Laterality: N/A;  . TUBAL LIGATION      Current Outpatient Prescriptions  Medication Sig Dispense Refill  . diazepam (VALIUM) 10 MG tablet Take 10 mg by mouth every 8 (eight) hours.     . fluticasone furoate-vilanterol (BREO ELLIPTA) 100-25 MCG/INH AEPB Inhale 1 puff into the lungs daily.    Marland Kitchen ibuprofen (ADVIL,MOTRIN) 200 MG tablet Take 400 mg by mouth every 8 (eight) hours as needed (for headaches.).    Marland Kitchen pantoprazole (PROTONIX) 40 MG tablet Take 1 tablet (40 mg total) by mouth 2 (two) times daily. 180 tablet 3  . Prenatal Vit-Fe Fumarate-FA (PNV PRENATAL PLUS MULTIVITAMIN) 27-1 MG TABS Take 1 tablet by mouth daily.    Marland Kitchen topiramate (TOPAMAX) 50 MG tablet Take 50 mg by mouth at bedtime.    . traZODone (DESYREL) 100 MG tablet Take 100 mg by mouth at bedtime as needed  for sleep.    . TRINTELLIX 20 MG TABS Take 20 mg by mouth daily at 12 noon.  0   No current facility-administered medications for this visit.     Allergies as of 09/23/2016  . (No Known Allergies)    Family History  Problem Relation Age of Onset  . Colon cancer Neg Hx   . Liver disease Neg Hx     Social History   Social History  . Marital status: Married    Spouse name: N/A  . Number of children: N/A  . Years of education: N/A   Social History  Main Topics  . Smoking status: Current Every Day Smoker    Packs/day: 1.00    Years: 30.00  . Smokeless tobacco: Never Used  . Alcohol use No     Comment: Evaluated 05/26/16: None since 04/25/16; Has been cutting back for 3 months (12/2015). Previously significant liquor use ($600/month worth)  . Drug use: No  . Sexual activity: Not Currently    Birth control/ protection: Surgical   Other Topics Concern  . Not on file   Social History Narrative  . No narrative on file    Review of Systems: General: Negative for anorexia, weight loss, fever, chills, fatigue, weakness. Eyes: Negative for vision changes.  ENT: Negative for hoarseness, difficulty swallowing , nasal congestion. CV: Negative for chest pain, angina, palpitations, dyspnea on exertion, peripheral edema.  Respiratory: Negative for dyspnea at rest, dyspnea on exertion, cough, sputum, wheezing.  GI: See history of present illness. GU:  Negative for dysuria, hematuria, urinary incontinence, urinary frequency, nocturnal urination.  MS: Negative for joint pain, low back pain.  Derm: Negative for rash or itching.  Neuro: Negative for weakness, abnormal sensation, seizure, frequent headaches, memory loss, confusion.  Psych: Negative for anxiety, depression, suicidal ideation, hallucinations.  Endo: Negative for unusual weight change.  Heme: Negative for bruising or bleeding. Allergy: Negative for rash or hives.   Physical Exam: There were no vitals taken for this visit. General:   Alert and oriented. Pleasant and cooperative. Well-nourished and well-developed.  Head:  Normocephalic and atraumatic. Eyes:  Without icterus, sclera clear and conjunctiva pink.  Ears:  Normal auditory acuity. Mouth:  No deformity or lesions, oral mucosa pink.  Throat/Neck:  Supple, without mass or thyromegaly. Cardiovascular:  S1, S2 present without murmurs appreciated. Normal pulses noted. Extremities without clubbing or edema. Respiratory:  Clear  to auscultation bilaterally. No wheezes, rales, or rhonchi. No distress.  Gastrointestinal:  +BS, soft, non-tender and non-distended. No HSM noted. No guarding or rebound. No masses appreciated.  Rectal:  Deferred  Musculoskalatal:  Symmetrical without gross deformities. Normal posture. Skin:  Intact without significant lesions or rashes. Neurologic:  Alert and oriented x4;  grossly normal neurologically. Psych:  Alert and cooperative. Normal mood and affect. Heme/Lymph/Immune: No significant cervical adenopathy. No excessive bruising noted.    09/23/2016 1:25 PM   Disclaimer: This note was dictated with voice recognition software. Similar sounding words can inadvertently be transcribed and may not be corrected upon review.

## 2018-07-15 ENCOUNTER — Encounter: Payer: Self-pay | Admitting: *Deleted

## 2018-07-15 NOTE — Progress Notes (Unsigned)
Patient has been screened for COVID/19 symptoms today.

## 2018-11-02 IMAGING — US US ABDOMEN COMPLETE W/ ELASTOGRAPHY
2 series · 13 of 25 positions shown · non-contrast
Comparison: None.

CLINICAL DATA: Evaluate for fibrosis. Chronic right upper quadrant
pain and elevated liver function tests.



[Series 1: us abdomen complete w/ elastography · 0.18mm/px · 11 of 138 slices shown (1 of 2)]
[im 1/138]
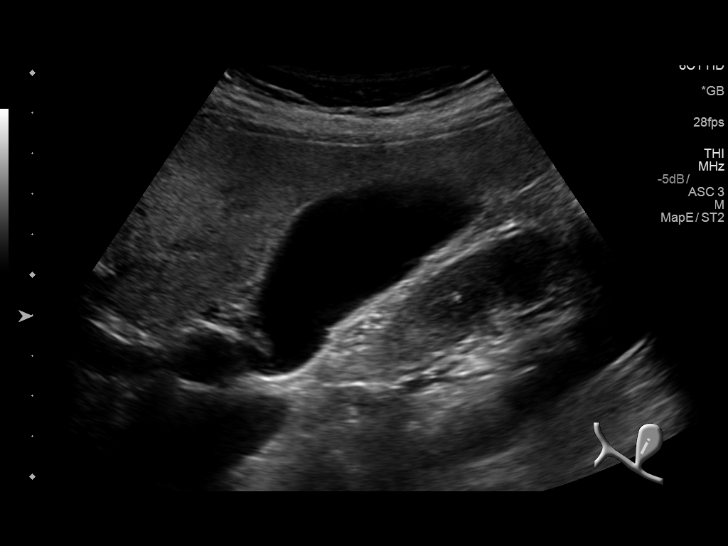
[im 14/138]
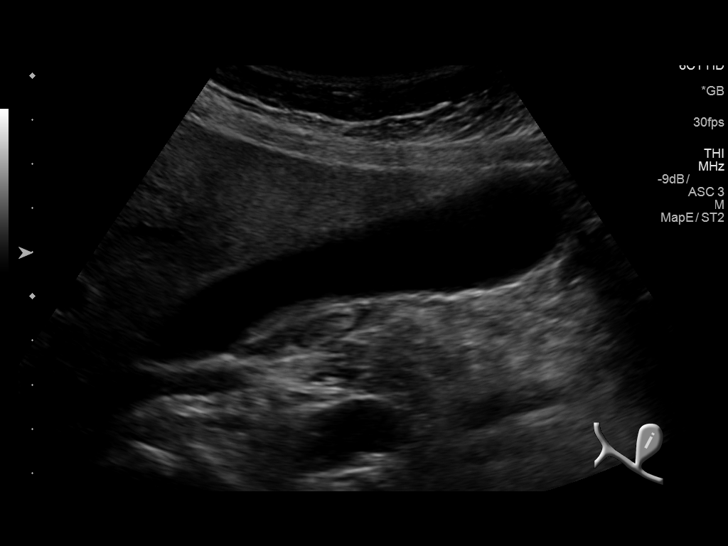
[im 27/138]
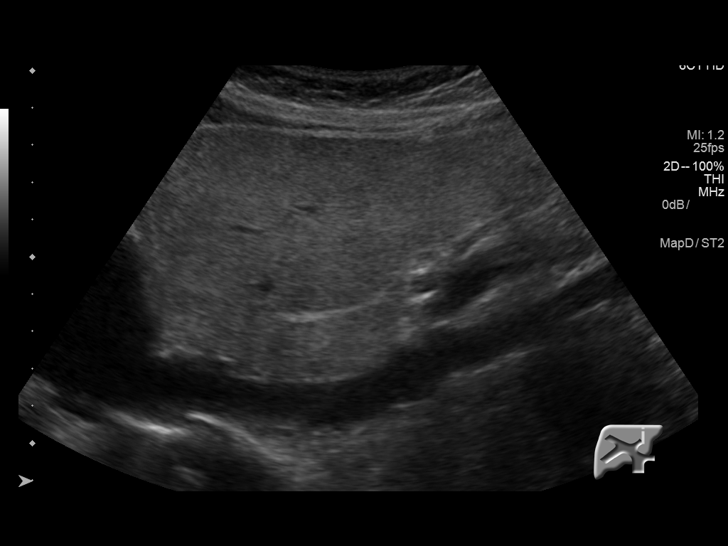
[im 40/138]
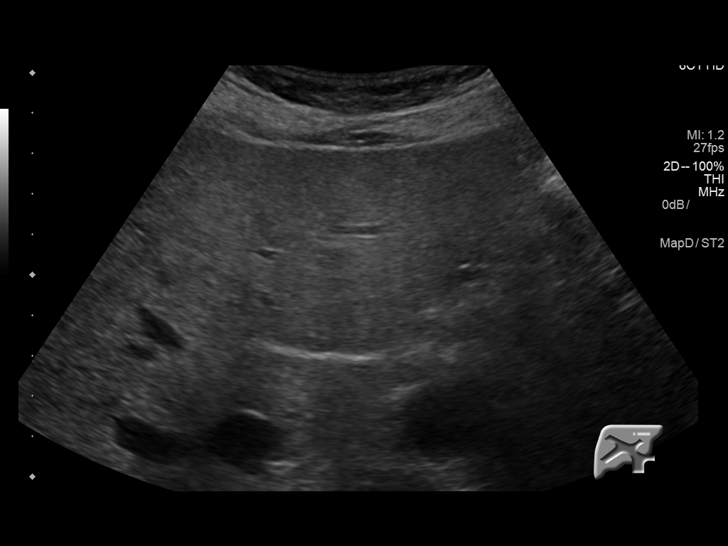
[im 53/138]
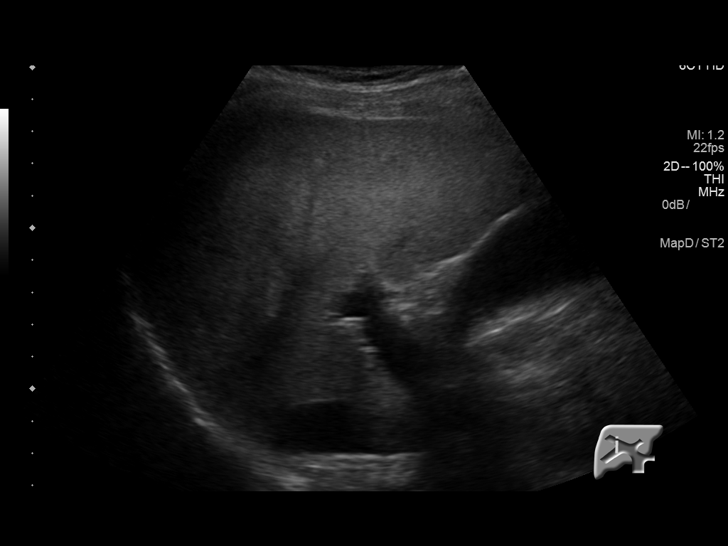
[im 66/138]
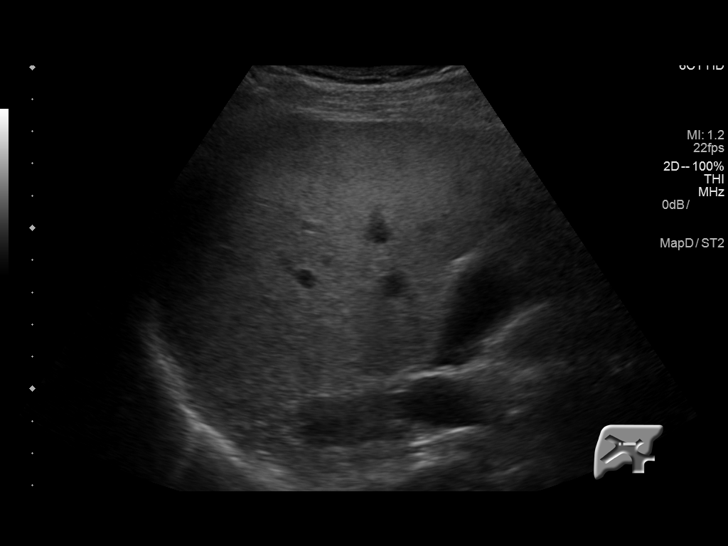
[im 79/138]
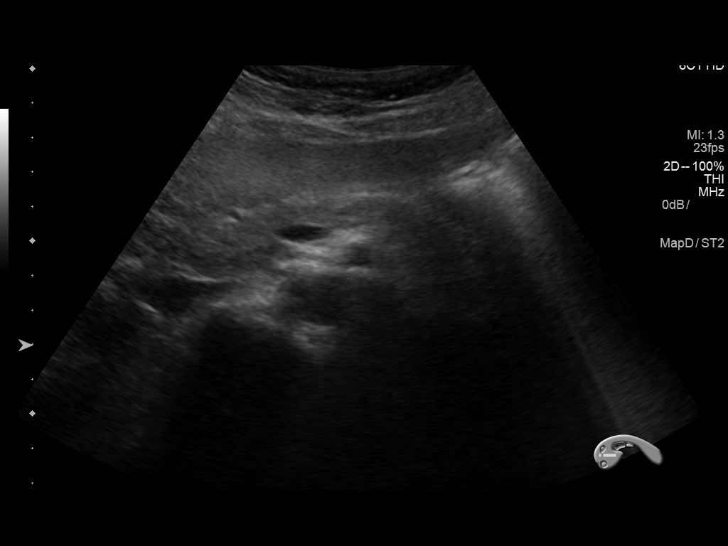
[im 92/138]
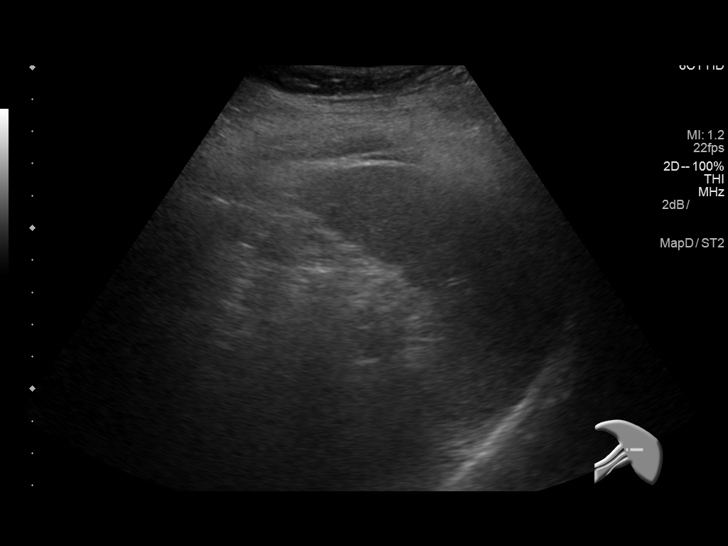
[im 105/138]
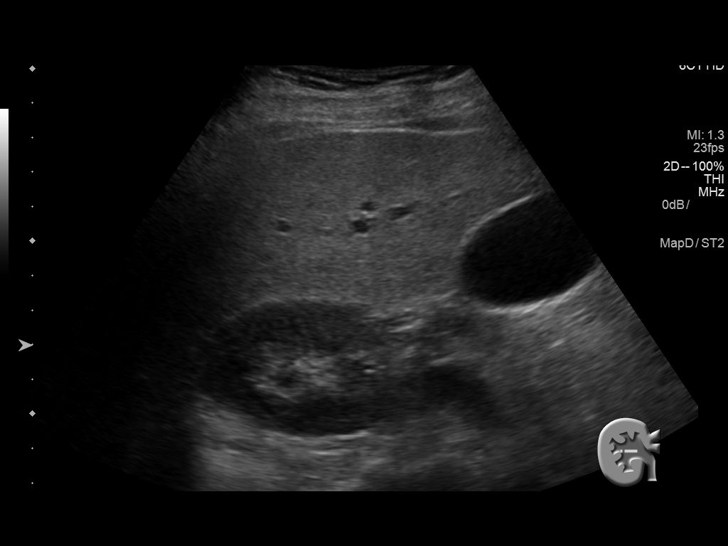
[im 118/138]
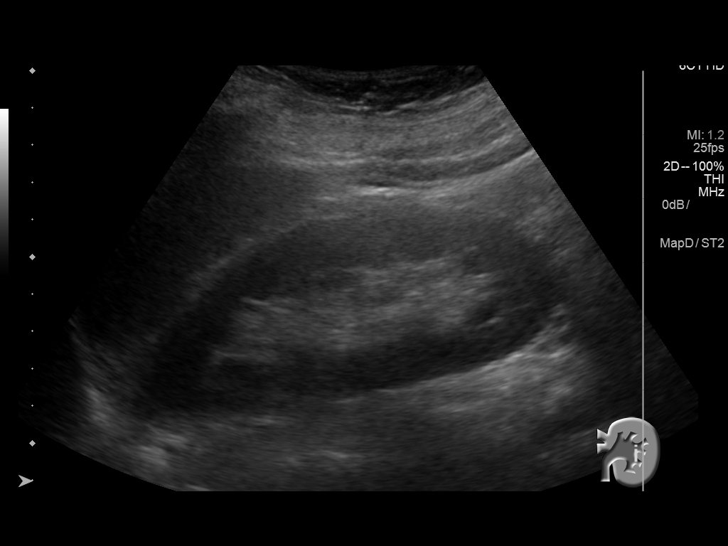
[im 131/138]
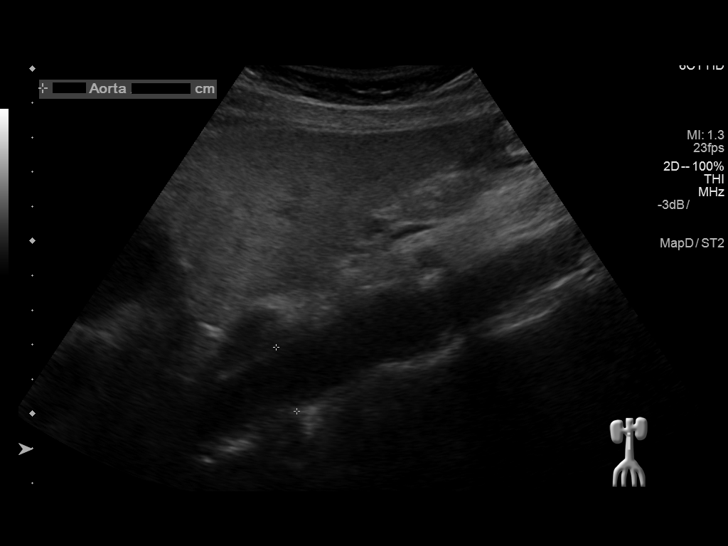

[Series 2001: us abdomen complete w/ elastography · 0.13mm/px · 2 of 20 slices shown (2 of 2)]
[im 1/20]
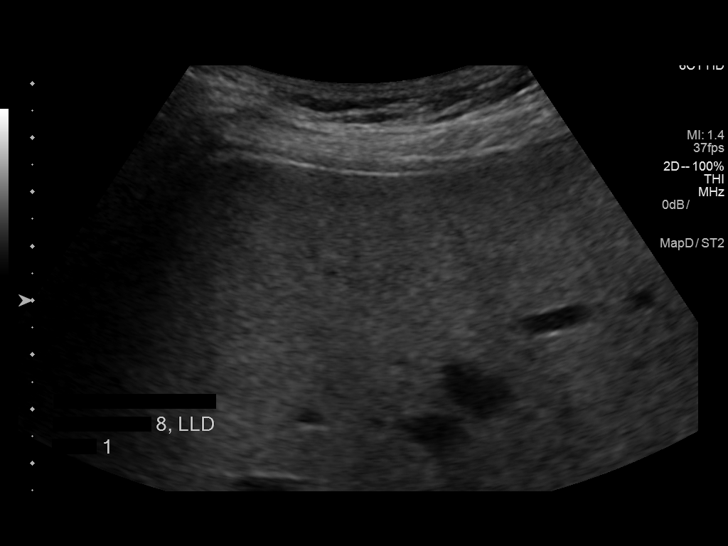
[im 20/20]
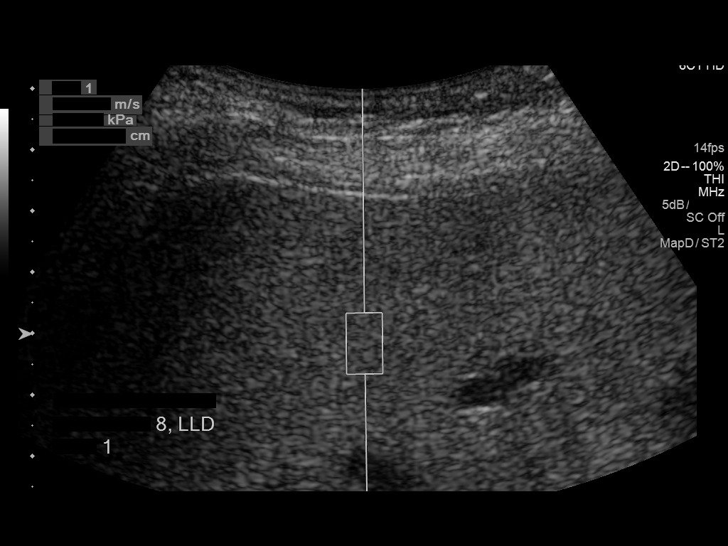

[13 of 25 positions shown; findings below may reference images not displayed]

FINDINGS: ULTRASOUND ABDOMEN

Gallbladder: No gallstones or wall thickening visualized. No
sonographic Murphy sign noted by sonographer.

Common bile duct: Diameter: Normal, 4 mm

Liver: Moderately increased hepatic echogenicity.

IVC: No abnormality visualized.

Pancreas: Visualized portion unremarkable.

Spleen: Size and appearance within normal limits.

Right Kidney: Length: 12.9 cm. Echogenicity within normal limits. No
mass or hydronephrosis visualized.

Left Kidney: Length: 11.8 cm. Echogenicity within normal limits. No
mass or hydronephrosis visualized.

Abdominal aorta: No aneurysm visualized.

Other findings: No ascites.

ULTRASOUND HEPATIC ELASTOGRAPHY

Device: Siemens Helix VTQ

Patient position: Left Lateral Decubitus

Transducer [id number]

Number of measurements: 10

Hepatic segment:  8

Median velocity:   2.90  m/sec

IQR:

IQR/Median velocity ratio:

Corresponding Metavir fibrosis score:  Some F3 + F4

Risk of fibrosis: High

Limitations of exam: None

Pertinent findings noted on other imaging exams:  None

Please note that abnormal shear wave velocities may also be
identified in clinical settings other than with hepatic fibrosis,
such as: acute hepatitis, elevated right heart and central venous
pressures including use of beta blockers, Onehi disease
([place]), infiltrative processes such as
mastocytosis/amyloidosis/infiltrative tumor, extrahepatic
cholestasis, in the post-prandial state, and liver transplantation.
Correlation with patient history, laboratory data, and clinical
condition recommended.
IMPRESSION: ULTRASOUND ABDOMEN:
Moderately increased echogenicity, most consistent with hepatic
steatosis.

ULTRASOUND HEPATIC ELASTOGRAPHY:

Median hepatic shear wave velocity is calculated at 2.90 m/sec.

Corresponding Metavir fibrosis score is  Some F3 + F4.

Risk of fibrosis is High.

Follow-up: Follow up advised

## 2021-08-18 DEATH — deceased
